# Patient Record
Sex: Male | Born: 1955
Health system: Southern US, Community
[De-identification: ages and names within clinical notes are randomized; demographics above are authoritative.]

## PROBLEM LIST (undated history)

## (undated) DIAGNOSIS — M75122 Complete rotator cuff tear or rupture of left shoulder, not specified as traumatic: Secondary | ICD-10-CM

## (undated) DIAGNOSIS — F419 Anxiety disorder, unspecified: Secondary | ICD-10-CM

## (undated) DIAGNOSIS — E7800 Pure hypercholesterolemia, unspecified: Secondary | ICD-10-CM

## (undated) DIAGNOSIS — E119 Type 2 diabetes mellitus without complications: Secondary | ICD-10-CM

## (undated) DIAGNOSIS — M1990 Unspecified osteoarthritis, unspecified site: Secondary | ICD-10-CM

## (undated) DIAGNOSIS — N200 Calculus of kidney: Secondary | ICD-10-CM

## (undated) HISTORY — PX: CHOLECYSTECTOMY: SHX55

## (undated) HISTORY — DX: Calculus of kidney: N20.0

## (undated) HISTORY — DX: Type 2 diabetes mellitus without complications: E11.9

## (undated) HISTORY — DX: Unspecified osteoarthritis, unspecified site: M19.90

## (undated) HISTORY — DX: Anxiety disorder, unspecified: F41.9

## (undated) HISTORY — DX: Pure hypercholesterolemia, unspecified: E78.00

## (undated) HISTORY — PX: LYMPH NODE BIOPSY: SHX201

---

## 2012-02-07 MED ORDER — MOMETASONE FUROATE 50 MCG/ACT NA SUSP
50 MCG/ACT | Freq: Every day | NASAL | Status: DC
Start: 2012-02-07 — End: 2012-04-16

## 2012-02-07 MED ORDER — NEXIUM 40 MG PO CPDR
40 MG | ORAL_CAPSULE | Freq: Every evening | ORAL | Status: DC
Start: 2012-02-07 — End: 2014-10-21

## 2012-02-07 NOTE — Patient Instructions (Signed)
Doctor recommends starting an over the counter allergy pill daily. Allegra is a good one.    You have been given a prescription for nexium & nasonex that has been electronically sent to your pharmacy for you to pick up. Please take as directed.    You were given samples of nasonex in the office today. Use 2 sprays in each nostril once a day. Use the left hand to spray the right nostril & the right hand to spray the left nostril. Sniff very gently.    Try propping the head of your bed up at night.    Prior to the nasal scope being used in the office today, your nose and throat were sprayed with a numbing agent.  Please wait at least 10-15 minutes after the procedure to eat or drink anything.  After this time has passed, you may then resume normal eating and drinking.      .        Gastroesophageal Reflux Disease (GERD): After Your Visit  Your Care Instructions  Gastroesophageal reflux disease (GERD) is the backward flow of stomach acid into the esophagus. The esophagus is the tube that leads from your throat to your stomach. A one-way valve prevents the stomach acid from moving up into this tube. When you have GERD, this valve does not close tightly enough.  If you have mild GERD symptoms including heartburn, you may be able to control the problem with antacids or over-the-counter medicine. Changing your diet, losing weight, and making other lifestyle changes can also help reduce symptoms.  Follow-up care is a key part of your treatment and safety. Be sure to make and go to all appointments, and call your doctor if you are having problems. It???s also a good idea to know your test results and keep a list of the medicines you take.  How can you care for yourself at home?  ?? Take your medicines exactly as prescribed. Call your doctor if you think you are having a problem with your medicine.  ?? Your doctor may recommend over-the-counter medicine. For mild or occasional indigestion, antacids, such as Tums, Gaviscon,  Mylanta, or Maalox, may help. Your doctor also may recommend over-the-counter acid reducers, such as Pepcid AC, Tagamet HB, Zantac 75, or Prilosec. Read and follow all instructions on the label. If you use these medicines often, talk with your doctor.  ?? Change your eating habits.  ?? It???s best to eat several small meals instead of two or three large meals.  ?? After you eat, wait 2 to 3 hours before you lie down.  ?? Chocolate, mint, and alcohol can make GERD worse.  ?? Spicy foods, foods that have a lot of acid (like tomatoes and oranges), and coffee can make GERD symptoms worse in some people. If your symptoms are worse after you eat a certain food, you may want to stop eating that food to see if your symptoms get better.  ?? Do not smoke or chew tobacco. Smoking can make GERD worse. If you need help quitting, talk to your doctor about stop-smoking programs and medicines. These can increase your chances of quitting for good.  ?? If you have GERD symptoms at night, raise the head of your bed 6 to 8 inches by putting the frame on blocks or placing a foam wedge under the head of your mattress. (Adding extra pillows does not work.)  ?? Do not wear tight clothing around your middle.  ?? Lose weight if you need to.  Losing just 5 to 10 pounds can help.  When should you call for help?  Call your doctor now or seek immediate medical care if:  ?? You have new or different belly pain.  ?? Your stools are black and tarlike or have streaks of blood.  Watch closely for changes in your health, and be sure to contact your doctor if:  ?? Your symptoms have not improved after 2 days.  ?? Food seems to catch in your throat or chest.    Where can you learn more?    Go to [email or web address] and sign in to your MyChart account.    Enter [phone] in the Search Health Information box to learn more about ???Gastroesophageal Reflux Disease (GERD): After Your Visit.???    If you do not have an account, please click on the ???Sign Up Now???  link.    ?? 2006-2012 [employer]. Care instructions adapted under license by [facility]. This care instruction is for use with your licensed healthcare professional. If you have questions about a medical condition or this instruction, always ask your healthcare professional. [employer] disclaims any warranty or liability for your use of this information.  Content Version: 9.4.94723; Last Revised: December 03, 2009

## 2012-02-09 NOTE — Progress Notes (Signed)
Subjective:      Patient ID: Fred Garcia is a 56 y.o. male.    HPI Comments: Pt seen and examined with a 4 month hx of increasing nasal congestion post nasal drip and globus. Pt has faial pressure daily. No treatment have made improvements up to this point. Pt denies any recent fever or chills, no vision changes. Pt state pressure is centered around his eyes and frontal region. Pt denies any difficulty swallowing, no SOB no dysphagia. Pt does state he has occasional reflux, no cough.     Other  Pertinent negatives include no coughing, fatigue, fever, neck pain, numbness or sore throat.       Review of Systems   Constitutional: Negative for fever, activity change, fatigue and unexpected weight change.   HENT: Negative for hearing loss, sore throat, rhinorrhea, trouble swallowing, neck pain, neck stiffness, voice change and sinus pressure.    Eyes: Negative.    Respiratory: Negative for apnea, cough and stridor.    Cardiovascular: Negative.    Allergic/Immunologic: Negative.    Neurological: Negative for dizziness, tremors, seizures, syncope, facial asymmetry and numbness.   Hematological: Negative.        Objective:   Physical Exam   Constitutional: He appears well-developed and well-nourished.   HENT:   Head: Normocephalic and atraumatic.   Right Ear: Hearing, tympanic membrane, external ear and ear canal normal.   Left Ear: Hearing, tympanic membrane, external ear and ear canal normal.   Nose: Mucosal edema and rhinorrhea present. No nasal deformity or septal deviation. Right sinus exhibits maxillary sinus tenderness. Left sinus exhibits maxillary sinus tenderness.   Mouth/Throat: Mucous membranes are normal. Normal dentition. No uvula swelling. No oropharyngeal exudate, posterior oropharyngeal edema or tonsillar abscesses.   Eyes: EOM are normal. Pupils are equal, round, and reactive to light.   Neck: Neck supple. No tracheal deviation present. No thyromegaly present.   Cardiovascular: Normal rate.     Pulmonary/Chest: Effort normal. No respiratory distress.   Lymphadenopathy:     He has no cervical adenopathy.   Neurological: He is alert. No cranial nerve deficit.   Skin: Skin is warm. No erythema.   Procedure Note    Pre-operative Diagnosis: Globus    Post-operative Diagnosis: same    Anesthesia: Lidocaine 2% and Neo-Synephrine 1/2%    Endoscopy Type:  Flexible Laryngoscopy    Procedure Details:    The patient was placed in the sitting position.  After topical anesthesia and decongestion, the 4 mm laryngoscope was passed.  The nasal cavities, nasopharynx, oropharynx, hypopharynx, and larynx were all examined.  Vocal cords were examined during respiration and phonation.  The following findings were noted:    Findings: Normal nasopharynx, normal epiglottis, normal tongue base, normal pyriform, normal TVC motion and mucosa, no subglottic masses or lesions post cricoid edema- moderate       Condition:  Stable.  Patient tolerated procedure well.    Complications:  None    Assessment:      LPR  Chronic Rhinitis   ? Chronic Sinusitis        Plan:      Pt seen and examined with a long standing history of nasal congestion, facial fullness and throat clearing. Today on exam pt has evidence of both LPR and chronic rhinitis . Pt will be maximized from a medical standpoint with both PPI therapy and nasal steroid. Pt will follow up in 6 weeks, if no better we will consider a Ct scan of the scan and  adding allergy regimen.

## 2012-03-27 MED ORDER — SUMATRIPTAN SUCCINATE 50 MG PO TABS
50 MG | ORAL_TABLET | Freq: Once | ORAL | Status: DC | PRN
Start: 2012-03-27 — End: 2014-09-08

## 2012-03-29 NOTE — Progress Notes (Signed)
Subjective:      Patient ID: Fred Garcia is a 56 y.o. male.    HPI Comments: Pt seen with Hx of LPR. Pt is overall improved with PPi therapy. Main complaint today and facial pressure centered over his frontal region as well as persistent and chronic headaches. No focal nasal drainage, no epistaxis no recent head trauma.     Other  Associated symptoms include congestion. Pertinent negatives include no coughing, diaphoresis, fever, neck pain or numbness.       Review of Systems   Constitutional: Negative for fever, diaphoresis, activity change, appetite change and unexpected weight change.   HENT: Positive for congestion. Negative for hearing loss, ear pain, nosebleeds, facial swelling, rhinorrhea, sneezing, drooling, mouth sores, trouble swallowing, neck pain, neck stiffness, dental problem, voice change, postnasal drip, sinus pressure and ear discharge.    Eyes: Negative for photophobia, pain, discharge, redness and visual disturbance.   Respiratory: Negative for apnea, cough, chest tightness, wheezing and stridor.    Cardiovascular: Negative.    Allergic/Immunologic: Negative.    Neurological: Negative for dizziness, seizures, facial asymmetry and numbness.   Hematological: Negative for adenopathy. Does not bruise/bleed easily.       Objective:   Physical Exam   Constitutional: He appears well-developed and well-nourished.   HENT:   Head: Normocephalic and atraumatic.   Right Ear: External ear normal.   Left Ear: External ear normal.   Nose: Nose normal.   Mouth/Throat: Oropharynx is clear and moist. No oropharyngeal exudate.   Eyes: EOM are normal. Pupils are equal, round, and reactive to light.   Neck: Normal range of motion. No tracheal deviation present. No thyromegaly present.   Cardiovascular: Normal rate.    Pulmonary/Chest: Effort normal. No respiratory distress.   Lymphadenopathy:     He has no cervical adenopathy.   Neurological: He is alert. No cranial nerve deficit.   Skin: Skin is warm. No erythema.        Assessment:      LPR- improved  Facial Pressure  ? Chronic Sinusitis vs. Migraine Variant        Plan:      Pt seen, LPR improved and will maintain current regimen for 6 weeks then consider PPi wean. Pt will also have Ct scan of sinuses to determine extent of sinus disease and any contributing factors to chronic HA and facial pain.

## 2012-04-16 MED ORDER — MOMETASONE FUROATE 50 MCG/ACT NA SUSP
50 MCG/ACT | Freq: Every day | NASAL | Status: DC
Start: 2012-04-16 — End: 2012-04-16

## 2012-04-16 MED ORDER — MOMETASONE FUROATE 50 MCG/ACT NA SUSP
50 MCG/ACT | Freq: Every day | NASAL | Status: DC
Start: 2012-04-16 — End: 2014-10-21

## 2012-04-20 NOTE — Progress Notes (Signed)
Subjective:      Patient ID: Fred Garcia is a 56 y.o. male.    HPI Comments: Pt is here for follow up Ct scan and progress with nasal steroids. Pt states he is improved from a nasal congestion standpoint but still has facial pain and pressure. No epistaxis, HA is relived by sleep but he often wakes up with them as we.. No relief for HA.  Ct scan reviewed.       Review of Systems   Constitutional: Negative for chills, activity change, appetite change and fatigue.   HENT: Negative for hearing loss, nosebleeds, congestion, facial swelling, drooling, mouth sores, postnasal drip, sinus pressure and tinnitus.    Eyes: Negative for photophobia, pain, discharge and itching.   Respiratory: Negative for cough, choking and chest tightness.    Cardiovascular: Negative.    Allergic/Immunologic: Negative for environmental allergies and food allergies.   Neurological: Positive for headaches. Negative for dizziness, tremors, seizures, speech difficulty, light-headedness and numbness.   Hematological: Negative.        Objective:   Physical Exam   Constitutional: He appears well-developed and well-nourished.   HENT:   Head: Normocephalic and atraumatic.   Right Ear: External ear normal.   Left Ear: External ear normal.   Nose: Nose normal.   Mouth/Throat: No oropharyngeal exudate.   Eyes: EOM are normal. Pupils are equal, round, and reactive to light.   Neck: Neck supple. No tracheal deviation present. No thyromegaly present.   Cardiovascular: Normal rate.    Pulmonary/Chest: No respiratory distress.   Lymphadenopathy:     He has no cervical adenopathy.   Neurological: He is alert.       Assessment:      Suspect Migraine Vs. Chronic HA  Mild sinus disease       Plan:      Pt is to continue his current regimen for nasal control. I do not think his HA is sinus related and I will refer him to Neurology. Pt will follow up in 3 months.

## 2012-04-24 NOTE — Telephone Encounter (Signed)
Spoke with patient appointment time for dr Noel Journey.

## 2013-06-28 ENCOUNTER — Inpatient Hospital Stay: Admit: 2013-06-28 | Discharge: 2013-06-28 | Discharge status: Against Medical Advice | Attending: Emergency Medicine

## 2013-06-28 MED ORDER — AMOXICILLIN-POT CLAVULANATE 500-125 MG PO TABS
500-125 MG | ORAL_TABLET | Freq: Two times a day (BID) | ORAL | Status: AC
Start: 2013-06-28 — End: 2013-07-08

## 2013-06-28 MED ORDER — PSEUDOEPH-BROMPHEN-DM 30-2-10 MG/5ML PO SYRP
2-30-10 MG/5ML | Freq: Four times a day (QID) | ORAL | Status: DC | PRN
Start: 2013-06-28 — End: 2014-09-08

## 2013-06-28 MED ORDER — PREDNISONE 10 MG PO TABS
10 MG | ORAL_TABLET | Freq: Every day | ORAL | Status: AC
Start: 2013-06-28 — End: 2013-07-08

## 2013-06-28 MED ADMIN — sodium chloride nebulizer 0.9 % solution 3 mL: RESPIRATORY_TRACT | @ 17:00:00 | NDC 00487930133

## 2013-06-28 MED ADMIN — levalbuterol (XOPENEX) nebulizer solution 1.25 mg: RESPIRATORY_TRACT | @ 17:00:00 | NDC 63402051500

## 2013-06-28 MED ADMIN — methylPREDNISolone sodium (SOLU-MEDROL) injection 125 mg: INTRAMUSCULAR | @ 17:00:00 | NDC 00009004725

## 2013-06-28 MED FILL — SODIUM CHLORIDE 0.9 % IN NEBU: 0.9 % | RESPIRATORY_TRACT | Qty: 3

## 2013-06-28 MED FILL — METHYLPREDNISOLONE SODIUM SUCC 125 MG IJ SOLR: 125 MG | INTRAMUSCULAR | Qty: 125

## 2013-06-28 MED FILL — LEVALBUTEROL HCL 1.25 MG/0.5ML IN NEBU: 1.25 MG/0.5ML | RESPIRATORY_TRACT | Qty: 1

## 2013-06-28 NOTE — ED Provider Notes (Signed)
Patient is a 57 y.o. male presenting with URI. The history is provided by the patient.   URI  Presenting symptoms: congestion, cough, facial pain, fatigue and rhinorrhea    Presenting symptoms: no ear pain, no fever and no sore throat    Severity:  Moderate  Onset quality:  Gradual  Duration:  2 weeks  Progression:  Worsening  Chronicity:  New  Associated symptoms: headaches and wheezing    Associated symptoms: no arthralgias        Review of Systems   Constitutional: Positive for fatigue. Negative for fever and chills.   HENT: Positive for congestion and rhinorrhea. Negative for ear pain, sore throat and sinus pressure.    Eyes: Negative for pain, discharge and redness.   Respiratory: Positive for cough and wheezing. Negative for shortness of breath.    Cardiovascular: Negative for chest pain.   Gastrointestinal: Negative for nausea, vomiting, abdominal pain and diarrhea.   Genitourinary: Negative for dysuria and frequency.   Musculoskeletal: Negative for back pain and arthralgias.   Skin: Negative for rash and wound.   Neurological: Positive for headaches. Negative for weakness.   Hematological: Negative for adenopathy.   All other systems reviewed and are negative.        Physical Exam   Constitutional: He is oriented to person, place, and time. He appears well-developed and well-nourished.   HENT:   Head: Normocephalic and atraumatic. No trismus in the jaw.   Right Ear: Hearing, tympanic membrane and external ear normal.   Left Ear: Hearing, tympanic membrane and external ear normal.   Nose: Mucosal edema present. Right sinus exhibits no maxillary sinus tenderness and no frontal sinus tenderness. Left sinus exhibits no maxillary sinus tenderness and no frontal sinus tenderness.   Mouth/Throat: Uvula is midline, oropharynx is clear and moist and mucous membranes are normal. No edematous.   Eyes: Conjunctivae, EOM and lids are normal. Pupils are equal, round, and reactive to light.   Neck: Normal range of  motion. Neck supple.   Cardiovascular: Normal rate, regular rhythm and normal heart sounds.    No murmur heard.  Pulmonary/Chest: Effort normal. He has rhonchi.   Abdominal: Soft. Bowel sounds are normal. There is no tenderness. There is no rigidity, no rebound, no guarding and no CVA tenderness.   Musculoskeletal: He exhibits no edema.   Neurological: He is alert and oriented to person, place, and time. He has normal strength. No cranial nerve deficit or sensory deficit. Coordination and gait normal. GCS eye subscore is 4. GCS verbal subscore is 5. GCS motor subscore is 6.   Skin: Skin is warm and dry. No abrasion and no rash noted.   Nursing note and vitals reviewed.      Procedures    MDM    --------------------------------------------- PAST HISTORY ---------------------------------------------  Past Medical History:  has a past medical history of Type II or unspecified type diabetes mellitus without mention of complication, not stated as uncontrolled; Sinus complaint; and GERD (gastroesophageal reflux disease).    Past Surgical History:  has past surgical history that includes Cholecystectomy.    Social History:  reports that he quit smoking about 6 years ago. His smoking use included Cigarettes, Pipe, and Cigars. He smoked 0.00 packs per day for 40 years. He has never used smokeless tobacco. He reports that  drinks alcohol. He reports that he does not use illicit drugs.    Family History: family history is not on file.     The patient???s home medications  have been reviewed.    Allergies: Review of patient's allergies indicates no known allergies.    -------------------------------------------------- RESULTS -------------------------------------------------  No results found for this visit on 06/28/13.       ------------------------- NURSING NOTES AND VITALS REVIEWED ---------------------------   The nursing notes within the ED encounter and vital signs as below have been reviewed.   BP 128/74   Pulse 74    Temp(Src) 97.9 ??F (36.6 ??C) (Oral)   Resp 16   Wt 230 lb (104.327 kg)   BMI 33 kg/m2   SpO2 98%  Oxygen Saturation Interpretation: Normal      ------------------------------------------ PROGRESS NOTES ------------------------------------------   I have spoken with the patient and discussed today???s results, in addition to providing specific details for the plan of care and counseling regarding the diagnosis and prognosis.  Their questions are answered at this time and they are agreeable with the plan.      --------------------------------- ADDITIONAL PROVIDER NOTES ---------------------------------     Inj Solumedrol 125 mg IM given along with Xopenex 1.25 mg via nebulizer.     This patient is stable for discharge.  I have shared the specific conditions for return, as well as the importance of follow-up.          Delma Officer, MD  06/28/13 [phone]

## 2013-06-28 NOTE — Discharge Instructions (Signed)
Bronchitis: After Your Visit  Your Care Instructions     Bronchitis is inflammation of the bronchial tubes, which carry air to the lungs. The tubes swell and produce mucus, or phlegm. The mucus and inflamed bronchial tubes make you cough. You may have trouble breathing.  Most cases of bronchitis are caused by viruses like those that cause colds. Antibiotics usually do not help and they may be harmful.  Bronchitis usually develops rapidly and lasts about 2 to 3 weeks in otherwise healthy people.  Follow-up care is a key part of your treatment and safety. Be sure to make and go to all appointments, and call your doctor if you are having problems. It's also a good idea to know your test results and keep a list of the medicines you take.  How can you care for yourself at home?   Take all medicines exactly as prescribed. Call your doctor if you think you are having a problem with your medicine.   Get some extra rest.   Take an over-the-counter pain medicine, such as acetaminophen (Tylenol), ibuprofen (Advil, Motrin), or naproxen (Aleve) to reduce fever and relieve body aches. Read and follow all instructions on the label.   Do not take two or more pain medicines at the same time unless the doctor told you to. Many pain medicines have acetaminophen, which is Tylenol. Too much acetaminophen (Tylenol) can be harmful.   Take an over-the-counter cough medicine that contains dextromethorphan to help quiet a dry, hacking cough so that you can sleep. Avoid cough medicines that have more than one active ingredient. Read and follow all instructions on the label.   Breathe moist air from a humidifier, hot shower, or sink filled with hot water. The heat and moisture will thin mucus so you can cough it out.   Do not smoke. Smoking can make bronchitis worse. If you need help quitting, talk to your doctor about stop-smoking programs and medicines. These can increase your chances of quitting for good.  When should you call for  help?  Call 911 anytime you think you may need emergency care. For example, call if:   You have severe trouble breathing.  Call your doctor now or seek immediate medical care if:   You have new or worse trouble breathing.   You cough up dark brown or bloody mucus (sputum).   You have a new or higher fever.   You have a new rash.  Watch closely for changes in your health, and be sure to contact your doctor if:   You cough more deeply or more often, especially if you notice more mucus or a change in the color of your mucus.   You are not getting better as expected.   Where can you learn more?   Go to [email or web address] and sign in to your MyChart account. Enter H333 in the Search Health Information box to learn more about "Bronchitis: After Your Visit."    If you do not have an account, please click on the "Sign Up Now" link.      2006-2014 [employer]. Care instructions adapted under license by [facility]. This care instruction is for use with your licensed healthcare professional. If you have questions about a medical condition or this instruction, always ask your healthcare professional. [employer] disclaims any warranty or liability for your use of this information.  Content Version: 10.1.311062; Current as of: May 18, 2011

## 2014-01-27 MED ADMIN — triamcinolone acetonide (KENALOG-40) injection 40 mg: 40 mg | INTRA_ARTICULAR | @ 18:00:00 | NDC 00003029305

## 2014-01-27 NOTE — Progress Notes (Signed)
Chief Complaint   Patient presents with   ??? Shoulder Pain     left shoulder pain for several years with no specific HOI        Fred Garcia is a 58 y.o. year old Caucasian male who is seen today  for evaluation of left shoulder pain.  he reports the pain has been ongoing for the past 2 years.  he does not recall a specific injury which started the pain.  he reports the pain is worse with use and overheadactivities, better with rest.  The patient does not have mechanical symptoms.   he does have night pain.  he denies a feeling of instability.  The prior treatments have been Injection  with no improvement  Therapy  completed, with no improvement.  The patient   has not responded to the treatment. The patient is right hand dominant. The patient is not working. The patients occupation is retirted.       Chief Complaint   Patient presents with   ??? Shoulder Pain     left shoulder pain for several years with no specific HOI     Past Medical History   Diagnosis Date   ??? Type II or unspecified type diabetes mellitus without mention of complication, not stated as uncontrolled (HCC)    ??? Sinus complaint    ??? GERD (gastroesophageal reflux disease)      Past Surgical History   Procedure Laterality Date   ??? Cholecystectomy       Current outpatient prescriptions:Atorvastatin Calcium (LIPITOR PO), Take by mouth, Disp: , Rfl: , ;  brompheniramine-pseudoephedrine-DM 30-2-10 MG/5ML syrup, Take 5 mLs by mouth 4 times daily as needed., Disp: 120 mL, Rfl: 0, ;  mometasone (NASONEX) 50 MCG/ACT nasal spray, 2 sprays by Nasal route daily. 2 sprays each nostril once a day, Disp: 3 Inhaler, Rfl: 3, ;  Testosterone 25 MG/2.5GM GEL, Place  onto the skin., Disp: , Rfl: ,   SUMAtriptan (IMITREX) 50 MG tablet, Take 1 tablet by mouth once as needed for Migraine for 1 dose., Disp: 5 tablet, Rfl: 0, ;  metformin (GLUCOPHAGE) 850 MG tablet, Take 850 mg by mouth 2 times daily (with meals)., Disp: , Rfl: , ;  glimepiride (AMARYL) 2 MG tablet, Take 2 mg  by mouth 2 times daily., Disp: , Rfl: , ;  NEXIUM 40 MG capsule, Take 1 capsule by mouth nightly., Disp: 30 capsule, Rfl: 3,   No Known Allergies  History     Social History   ??? Marital Status: Divorced     Spouse Name: N/A     Number of Children: N/A   ??? Years of Education: N/A     Occupational History   ??? Not on file.     Social History Main Topics   ??? Smoking status: Former Smoker -- 40 years     Types: Cigarettes, Pipe, Cigars     Quit date: 02/07/2007   ??? Smokeless tobacco: Never Used   ??? Alcohol Use: Yes   ??? Drug Use: No   ??? Sexual Activity: Not on file     Other Topics Concern   ??? Not on file     Social History Narrative     No family history on file.    REVIEW OF SYSTEMS:     General/Constitution:  (-)weight loss, (-)fever, (-)chills, (-)weakness.   Skin: (-) rash,(-) psoriasis,(-) eczema, (-)skin cancer.   Musculoskeletal: (-) fractures,  (-) dislocations,(-) collagen vascular disease, (-) fibromyalgia, (-) multiple  sclerosis, (-) muscular dystrophy, (-) RSD,(-) joint pain (-)swelling, (-) joint pain,swelling.  Neurologic: (-) epilepsy, (-)seizures,(-) brain tumor,(-) TIA, (-)stroke, (-)headaches, (-)Parkinson disease,(-) memory loss, (-) LOC.  Cardiovascular: (-) Chest pain, (-) swelling in legs/feet, (-) SOB, (-) cramping in legs/feet with walking.  Respiratory: (-) SOB, (-) Coughing, (-) night sweats.  GI: (-) nausea, (-) vomiting, (-) diarrhea, (-) blood in stool, (-) gastric ulcer.  Psychiatric: (-) Depression, (-) Anxiety, (-) bipolar disease, (-) Alzheimer's Disease  Allergic/Immunologic: (-) allergies latex, (-) allergies metal, (-) skin sensitivity.  Hematlogic: (-) anemia, (-) blood transfusion, (-) DVT/PE, (-) Clotting disorders      Subjective:    Constitution:    The patient is alert and oriented x 3, appears to be stated age and in no distress.  5 feet 10 inches, 230 pounds    Skin:    Upon inspection: the skin appears warm, dry and intact.  There is not a previous scar over the affected  area.There is not any cellulitis, lymphedema or cutaneous lesions noted in the lower extremities.   Upon palpation there is no induration noted.          Neurologic:    Gait: normal;  Motor exam of the upper extremities show: The reflexes in biceps/triceps/brachioradialis are equal and symmetric.  Sensory exam C5-T1 are normal bilaterally.          Cardiovascular:    The vascular exam is normal and is well perfused to distal extremities.There are 2+ radial pulses bilaterally, and motor and sensation is intact to median, ulnar, and radial, musclocutaneus, and axillary nerve distribution and grossly symmetric bilaterally.  There is cap refill noted less than two seconds in all digits. There is not edema of the bilateral upper extremities.  There is not varicosities noted in the distal extremities.      Lymph:    Upon palpation,  there is no lymphadenopathy noted in bilateral upper extremities.      Musculoskeletal:    Cervical Exam:    On physical exam, Fred CaperWilliam Garcia is well-developed, well-nourished, oriented to person, place and time.  his gait is normal.  On evaluation of his cervical spine, he has full range of motion of the cervical spine without pain.  There is no cervical tenderness to palpation.     Shoulder Exam:     On evaluation of his bilaterally upper extremities, his left shoulder has no deformity.  There is tenderness upon palpation of the anterior shoulder.  There is not evidence of scapular dyskinesis.  There is not muscle atrophy in shoulder girdle. The range of motion for the Right Shoulder is 160/45/T10 and for the Left shoulder is 140/40/L2.  Right shoulder Motor strength is 5/5 in the supraspinatus, 5/5 internal rotation and 5/5 in external rotation, and Left shoulder motor strength 5-/5 in supraspinatus, 5/5 in internal rotation, 5/5 in external rotation.        Right shoulder:  negative Impingement , negative Hawkins ,negative  Speeds,negative  Apprehension ,negative Obrien Load Shift, negative  Sperling manuver, negative Cross arm test.     Left shoulder:  positive Impingement , positive Hawkins ,positive  Speeds,negative  Apprehension ,negative Obrien Load Shift, negative Sperling manuver, negative Cross arm test.     XRAY:  None taken    MRI:  Disc from outside facilty reveals a partial RTC tear    Impression:   Encounter Diagnoses   Name Primary?   ??? Rotator cuff tear Yes   ??? Shoulder impingement    ???  Shoulder pain    ??? Biceps tendonitis        Plan: Natural history and expected course discussed. Questions answered.  Reduction in offending activity.   Consent was obtained by the patient.  Skin was prepped with alcohol and betadine.  The injection was given through the posterior aspect of Left shoulder into the subacromial space.  The patient tolerated the injection well.        I discussed the risks and benefits of the shoulder arthroscopy with the patient.  The risks include but are not limited to: infection, injuries to blood vessels and nerves, non relief of symptoms, intraoperative fracture, need for further operative intervention, blood loss, arthrofibrosis of shoulder, DVT/PE, MI and death.  The patient understands these risks and wishes to proceed with surgery. I will perform a Left shoulder arthroscopy, SAD and debridement in the fall 2015.

## 2014-04-21 NOTE — ED Notes (Signed)
Pt girlfriend states she heard "wooshing" noise from pt heart while they were walking. No abnormal sounds heard upon auscultation by this RN. Awaiting physician assessment.     Hilbert Bible, RN  04/21/14 2322

## 2014-04-21 NOTE — Discharge Instructions (Signed)
Palpitations: After Your Visit  Your Care Instructions     Heart palpitations are the uncomfortable sensation that your heart is beating fast or irregularly. You might feel pounding or fluttering in your chest. It might feel like your heart is skipping a beat.  Although palpitations may be caused by a heart problem, they also occur because of stress, fatigue, or use of alcohol, caffeine, or nicotine. Many medicines, including diet pills, antihistamines, decongestants, and some herbal products, can cause heart palpitations. Nearly everyone has palpitations from time to time.  Depending on your symptoms, your doctor may need to do more tests to try to find the cause of your palpitations.  Follow-up care is a key part of your treatment and safety. Be sure to make and go to all appointments, and call your doctor if you are having problems. It's also a good idea to know your test results and keep a list of the medicines you take.  How can you care for yourself at home?   Avoid caffeine, nicotine, and excess alcohol.   Do not take illegal drugs, such as methamphetamines and cocaine.   Do not take weight loss or diet medicines unless you talk with your doctor first.   Get plenty of sleep.   Do not overeat.   If you have palpitations again, take deep breaths and try to relax. This may slow a racing heart.   If you start to feel lightheaded, lie down to avoid injuries that might result if you pass out and fall down.   Keep a record of your palpitations and bring it to your next doctor's appointment. Write down:   The date and time.   Your pulse. (If your heart is beating fast, it may be hard to count your pulse.)   What you were doing when the palpitations started.   How long the palpitations lasted.   Any other symptoms.   If an activity causes palpitations, slow down or stop. Talk to your doctor before you do that activity again.   Take your medicines exactly as prescribed. Call your doctor if you think  you are having a problem with your medicine.  When should you call for help?  Call 911 anytime you think you may need emergency care. For example, call if:   You passed out (lost consciousness).   You have symptoms of a heart attack. These may include:   Chest pain or pressure, or a strange feeling in the chest.   Sweating.   Shortness of breath.   Pain, pressure, or a strange feeling in the back, neck, jaw, or upper belly or in one or both shoulders or arms.   Lightheadedness or sudden weakness.   A fast or irregular heartbeat.  After you call 911, the operator may tell you to chew 1 adult-strength or 2 to 4 low-dose aspirin. Wait for an ambulance. Do not try to drive yourself.   You have signs of a stroke. These may include:   Sudden numbness, paralysis, or weakness in your face, arm, or leg, especially on only one side of your body.   New problems with walking or balance.   Sudden vision changes.   Drooling or slurred speech.   New problems speaking or understanding simple statements, or feeling confused.   A sudden, severe headache that is different from past headaches.  Call your doctor now or seek immediate medical care if:   You have heart palpitations and:   Are dizzy or lightheaded, or   you feel like you may faint.   Have new or increased shortness of breath.  Watch closely for changes in your health, and be sure to contact your doctor if:   You continue to have heart palpitations.   Where can you learn more?   Go to [email or web address] and sign in to your MyChart account. Enter R508 in the Search Health Information box to learn more about "Palpitations: After Your Visit."    If you do not have an account, please click on the "Sign Up Now" link.      2006-2015 [employer]. Care instructions adapted under license by [facility]. This care instruction is for use with your licensed healthcare professional. If you have questions about a medical condition or  this instruction, always ask your healthcare professional. [employer], Inc[employer] disclaims any warranty or liability for your use of this information.  Content Version: 10.5.422740; Current as of: November 08, 2013

## 2014-04-22 ENCOUNTER — Inpatient Hospital Stay
Admit: 2014-04-22 | Discharge: 2014-04-22 | Disposition: A | Discharge status: Home / Self Care | Attending: Emergency Medicine

## 2014-04-22 LAB — CBC WITH AUTO DIFFERENTIAL
Basophils %: 1 % (ref 0–2)
Basophils Absolute: 0.04 E9/L (ref 0.00–0.20)
Eosinophils %: 6 % (ref 0–6)
Eosinophils Absolute: 0.52 E9/L — ABNORMAL HIGH (ref 0.05–0.50)
Hematocrit: 39 % (ref 37.0–54.0)
Hemoglobin: 13 g/dL (ref 12.5–16.5)
Lymphocytes %: 26 % (ref 20–42)
Lymphocytes Absolute: 2.07 E9/L (ref 1.50–4.00)
MCH: 30.6 pg (ref 26.0–35.0)
MCHC: 33.2 % (ref 32.0–34.5)
MCV: 92 fL (ref 80.0–99.9)
MPV: 10.3 fL (ref 7.0–12.0)
Monocytes %: 7 % (ref 2–12)
Monocytes Absolute: 0.59 E9/L (ref 0.10–0.95)
Neutrophils %: 60 % (ref 43–80)
Neutrophils Absolute: 4.88 E9/L (ref 1.80–7.30)
Platelets: 173 E9/L (ref 130–450)
RBC: 4.24 E12/L (ref 3.80–5.80)
RDW: 13.3 fL (ref 11.5–15.0)
WBC: 8.1 E9/L (ref 4.5–11.5)

## 2014-04-22 LAB — BASIC METABOLIC PANEL
Anion Gap: 15 mmol/L (ref 7–16)
BUN: 10 mg/dL (ref 6–20)
CO2: 23 mmol/L (ref 22–29)
Calcium: 9.1 mg/dL (ref 8.6–10.2)
Chloride: 103 mmol/L (ref 98–107)
Creatinine: 0.8 mg/dL (ref 0.7–1.2)
GFR African American: >60
GFR Non-African American: >60 mL/min/{1.73_m2} (ref >=60)
Glucose: 156 mg/dL — ABNORMAL HIGH (ref 74–109)
Potassium: 4.1 mmol/L (ref 3.5–5.0)
Sodium: 141 mmol/L (ref 132–146)

## 2014-04-22 LAB — BRAIN NATRIURETIC PEPTIDE: Pro-BNP: 29 pg/mL (ref 0–125)

## 2014-04-22 LAB — D-DIMER, QUANTITATIVE: D-Dimer, Quant: <200 ng/mL DDU

## 2014-04-22 LAB — TROPONIN: Troponin: <0.01 ng/mL (ref 0.00–0.03)

## 2014-04-22 NOTE — ED Provider Notes (Signed)
HPI:  04/22/2014,   Time: 12:12 AM         Katharina CaperWilliam Hakanson is a 58 y.o. male presenting to the ED for "wooshing in his chest when he walks", beginning several weeks ago.  The complaint has been intermittent, mild in severity, and worsened by strenuous exertion.  Patient presents with "whooshing sound in my chest when I walk". States his been going on for the last several weeks. He states he recently started exercising, when he strenuous and walking he feels a "[place]" in his chest, his girlfriend states tonight she hurt it when they were walking as well. He denies chest pain shortness of breath nausea vomiting swelling or recent weight gain. States  he had a stress test a few years ago and was told it was normal.    ROS:   Pertinent positives and negatives are stated within HPI, all other systems reviewed and are negative.  --------------------------------------------- PAST HISTORY ---------------------------------------------  Past Medical History:  has no past medical history on file.    Past Surgical History:  has no past surgical history on file.    Social History:  reports that he has never smoked. He does not have any smokeless tobacco history on file. He reports that he drinks alcohol.    Family History: family history is not on file.     The patient???s home medications have been reviewed.    Allergies: Review of patient's allergies indicates no known allergies.    -------------------------------------------------- RESULTS -------------------------------------------------  All laboratory and radiology results have been personally reviewed by myself   LABS:  Results for orders placed during the hospital encounter of 04/21/14   TROPONIN       Result Value Ref Range    Troponin <0.01  0.00 - 0.03 ng/mL   CBC WITH AUTO DIFFERENTIAL       Result Value Ref Range    WBC 8.1  4.5 - 11.5 E9/L    RBC 4.24  3.80 - 5.80 E12/L    Hemoglobin 13.0  12.5 - 16.5 g/dL    Hematocrit 76.139.0  60.737.0 - 54.0 %    MCV 92.0  80.0 - 99.9 fL     MCH 30.6  26.0 - 35.0 pg    MCHC 33.2  32.0 - 34.5 %    RDW 13.3  11.5 - 15.0 fL    Platelets 173  130 - 450 E9/L    MPV 10.3  7.0 - 12.0 fL    Neutrophils Relative 60  43 - 80 %    Lymphocytes Relative 26  20 - 42 %    Monocytes Relative 7  2 - 12 %    Eosinophils Relative Percent 6  0 - 6 %    Basophils Relative 1  0 - 2 %    Neutrophils Absolute 4.88  1.80 - 7.30 E9/L    Lymphocytes Absolute 2.07  1.50 - 4.00 E9/L    Monocytes Absolute 0.59  0.10 - 0.95 E9/L    Eosinophils Absolute 0.52 (*) 0.05 - 0.50 E9/L    Basophils Absolute 0.04  0.00 - 0.20 E9/L   BASIC METABOLIC PANEL       Result Value Ref Range    Sodium 141  132 - 146 mmol/L    Potassium 4.1  3.5 - 5.0 mmol/L    Chloride 103  98 - 107 mmol/L    CO2 23  22 - 29 mmol/L    Anion Gap 15  7 - 16 mmol/L  Glucose 156 (*) 74 - 109 mg/dL    BUN 10  6 - 20 mg/dL    CREATININE 0.8  0.7 - 1.2 mg/dL    GFR Non-African American >60  >=60 mL/min/1.73    GFR African American >60      Calcium 9.1  8.6 - 10.2 mg/dL   BRAIN NATRIURETIC PEPTIDE       Result Value Ref Range    Pro-BNP 29  0 - 125 pg/mL   D-DIMER, QUANTITATIVE       Result Value Ref Range    D-Dimer, Quant <200         RADIOLOGY:  Interpreted by Radiologist.  XR CHEST PORTABLE    Final Result: IMPRESSION:  Normal chest.           ------------------------- NURSING NOTES AND VITALS REVIEWED ---------------------------   The nursing notes within the ED encounter and vital signs as below have been reviewed.   BP 138/76    Pulse 79    Temp(Src) 98.3 ??F (36.8 ??C) (Temporal)    Resp 17    Ht 5\' 10"  (1.778 m)    Wt 230 lb (104.327 kg)    BMI 33.00 kg/m2      SpO2 97%   Oxygen Saturation Interpretation: Normal      ---------------------------------------------------PHYSICAL EXAM--------------------------------------       Constitutional/General: Alert and oriented x3, well appearing, non toxic in NAD  Head: NC/AT  Eyes: PERRL, EOMI  Mouth: Oropharynx clear, handling secretions, no trismus  Neck: Supple, full  ROM, no meningeal signs no JVD   Pulmonary: Lungs clear to auscultation bilaterally, no wheezes, rales, or rhonchi. Not in respiratory distress  Cardiovascular:  Regular rate and rhythm, no murmurs, gallops, or rubs. 2+ distal pulses. Remains unchanged under deep inspiration and Valsalva  Abdomen: Soft, non tender, non distended,   Extremities: Moves all extremities x 4. Warm and well perfused There is no pretibial edema nor calf tenderness bilaterally     Skin: warm and dry without rash  Neurologic: GCS 15,  Psych: Normal Affect    EKG:  This EKG is signed and interpreted by me.    Rate: 79  Rhythm: Sinus  Interpretation: Sinus rhythm, normal axis, no other acute findings  Comparison: no previous EKG available      ------------------------------ ED COURSE/MEDICAL DECISION MAKING----------------------  Medications - No data to display      Medical Decision Making:    Workup within normal limits, discussed the likely need for an outpatient echocardiogram. Have referred him to 2 local cardiologist near where he lives, follow-up with primary care and cardiology return for worsening signs or symptoms.,    Counseling:   The emergency provider has spoken with the patient and discussed today???s results, in addition to providing specific details for the plan of care and counseling regarding the diagnosis and prognosis.  Questions are answered at this time and they are agreeable with the plan.      --------------------------------- IMPRESSION AND DISPOSITION ---------------------------------    IMPRESSION  1. Palpitations        DISPOSITION  Disposition: Discharge to home  Patient condition is stable                  Raphael Gibney, DO  04/22/14 0021

## 2014-05-08 LAB — EKG 12-LEAD
ECG Heart Rate: 69 /min
ECG P Duration: 120 ms
ECG QRS Axis: -1 deg
ECG QRS Duration: 80 ms
ECG QT Dispersion: 20 ms
ECG QTC Interval: 422 ms
ECG RR Interval: 869 ms
P Axis: 32 deg
P-R Interval: 150 ms
Q-T Interval: 394 ms
T Axis: 23 deg

## 2014-06-30 ENCOUNTER — Ambulatory Visit
Admit: 2014-06-30 | Discharge: 2014-06-30 | Payer: PRIVATE HEALTH INSURANCE | Attending: Orthopaedic Surgery | Primary: Family Medicine

## 2014-06-30 DIAGNOSIS — M75122 Complete rotator cuff tear or rupture of left shoulder, not specified as traumatic: Secondary | ICD-10-CM

## 2014-06-30 MED ORDER — TRIAMCINOLONE ACETONIDE 40 MG/ML IJ SUSP
40 MG/ML | Freq: Once | INTRAMUSCULAR | Status: AC
Start: 2014-06-30 — End: 2014-06-30
  Administered 2014-06-30: 14:00:00 40 mg via INTRA_ARTICULAR

## 2014-06-30 NOTE — Progress Notes (Signed)
Chief Complaint   Patient presents with   ??? Shoulder Pain     wants to discuss left shoulder scope been hurting for years no history of injury  also wants cortisone injection        Fred Garcia is a 58 y.o. year old Caucasian male who is seen today for follow up of his left shoulder.  He did have relief with the previous cortisone injection.      Chief Complaint   Patient presents with   ??? Shoulder Pain     wants to discuss left shoulder scope been hurting for years no history of injury  also wants cortisone injection     Past Medical History   Diagnosis Date   ??? Type II or unspecified type diabetes mellitus without mention of complication, not stated as uncontrolled (HCC)    ??? Sinus complaint    ??? GERD (gastroesophageal reflux disease)      Past Surgical History   Procedure Laterality Date   ??? Cholecystectomy       Current outpatient prescriptions:Atorvastatin Calcium (LIPITOR PO), Take by mouth, Disp: , Rfl: ;  brompheniramine-pseudoephedrine-DM 30-2-10 MG/5ML syrup, Take 5 mLs by mouth 4 times daily as needed., Disp: 120 mL, Rfl: 0;  mometasone (NASONEX) 50 MCG/ACT nasal spray, 2 sprays by Nasal route daily. 2 sprays each nostril once a day, Disp: 3 Inhaler, Rfl: 3;  Testosterone 25 MG/2.5GM GEL, Place  onto the skin., Disp: , Rfl:   SUMAtriptan (IMITREX) 50 MG tablet, Take 1 tablet by mouth once as needed for Migraine for 1 dose., Disp: 5 tablet, Rfl: 0;  metformin (GLUCOPHAGE) 850 MG tablet, Take 850 mg by mouth 2 times daily (with meals)., Disp: , Rfl: ;  glimepiride (AMARYL) 2 MG tablet, Take 2 mg by mouth 2 times daily., Disp: , Rfl: ;  NEXIUM 40 MG capsule, Take 1 capsule by mouth nightly., Disp: 30 capsule, Rfl: 3  No Known Allergies  History     Social History   ??? Marital Status: Divorced     Spouse Name: N/A     Number of Children: N/A   ??? Years of Education: N/A     Occupational History   ??? Not on file.     Social History Main Topics   ??? Smoking status: Former Smoker -- 40 years     Types: Cigarettes,  Pipe, Cigars     Quit date: 02/07/2007   ??? Smokeless tobacco: Never Used   ??? Alcohol Use: Yes   ??? Drug Use: No   ??? Sexual Activity: Not on file     Other Topics Concern   ??? Not on file     Social History Narrative     History reviewed. No pertinent family history.    REVIEW OF SYSTEMS:     General/Constitution:  (-)weight loss, (-)fever, (-)chills, (-)weakness.   Skin: (-) rash,(-) psoriasis,(-) eczema, (-)skin cancer.   Musculoskeletal: (-) fractures,  (-) dislocations,(-) collagen vascular disease, (-) fibromyalgia, (-) multiple sclerosis, (-) muscular dystrophy, (-) RSD,(-) joint pain (-)swelling, (-) joint pain,swelling.  Neurologic: (-) epilepsy, (-)seizures,(-) brain tumor,(-) TIA, (-)stroke, (-)headaches, (-)Parkinson disease,(-) memory loss, (-) LOC.  Cardiovascular: (-) Chest pain, (-) swelling in legs/feet, (-) SOB, (-) cramping in legs/feet with walking.  Respiratory: (-) SOB, (-) Coughing, (-) night sweats.  GI: (-) nausea, (-) vomiting, (-) diarrhea, (-) blood in stool, (-) gastric ulcer.  Psychiatric: (-) Depression, (-) Anxiety, (-) bipolar disease, (-) Alzheimer's Disease  Allergic/Immunologic: (-) allergies latex, (-)  allergies metal, (-) skin sensitivity.  Hematlogic: (-) anemia, (-) blood transfusion, (-) DVT/PE, (-) Clotting disorders      Subjective:      Constitution:    The patient is alert and oriented x 3, appears to be stated age and in no distress.  5 feet 10 inches, 230 pounds    Skin:    Upon inspection: the skin appears warm, dry and intact.  There is not a previous scar over the affected area.There is not any cellulitis, lymphedema or cutaneous lesions noted in the lower extremities.   Upon palpation there is no induration noted.          Neurologic:    Gait: normal;  Motor exam of the upper extremities show: The reflexes in biceps/triceps/brachioradialis are equal and symmetric.  Sensory exam C5-T1 are normal bilaterally.          Cardiovascular:    The vascular exam is normal and is  well perfused to distal extremities.There are 2+ radial pulses bilaterally, and motor and sensation is intact to median, ulnar, and radial, musclocutaneus, and axillary nerve distribution and grossly symmetric bilaterally.  There is cap refill noted less than two seconds in all digits. There is not edema of the bilateral upper extremities.  There is not varicosities noted in the distal extremities.      Lymph:    Upon palpation,  there is no lymphadenopathy noted in bilateral upper extremities.      Musculoskeletal:    Cervical Exam:    On physical exam, Katharina CaperWilliam Longino is well-developed, well-nourished, oriented to person, place and time.  his gait is normal.  On evaluation of his cervical spine, he has full range of motion of the cervical spine without pain.  There is no cervical tenderness to palpation.     Shoulder Exam:     On evaluation of his bilaterally upper extremities, his left shoulder has no deformity.  There is tenderness upon palpation of the anterior shoulder.  There is not evidence of scapular dyskinesis.  There is not muscle atrophy in shoulder girdle. The range of motion for the Right Shoulder is 160/45/T10 and for the Left shoulder is 140/40/L2.  Right shoulder Motor strength is 5/5 in the supraspinatus, 5/5 internal rotation and 5/5 in external rotation, and Left shoulder motor strength 5-/5 in supraspinatus, 5/5 in internal rotation, 5/5 in external rotation.        Right shoulder:  negative Impingement , negative Hawkins ,negative  Speeds,negative  Apprehension ,negative Obrien Load Shift, negative Sperling manuver, negative Cross arm test.     Left shoulder:  positive Impingement , positive Hawkins ,positive  Speeds,negative  Apprehension ,negative Obrien Load Shift, negative Sperling manuver, negative Cross arm test.     XRAY:  None taken    MRI:  Disc from outside facilty reveals a partial RTC tear    Impression:   Encounter Diagnoses   Name Primary?   ??? Complete tear of left rotator cuff Yes    ??? Shoulder impingement, left    ??? Biceps tendonitis, left    ??? Left shoulder pain        Plan: Natural history and expected course discussed. Questions answered.  Reduction in offending activity.   I discussed the treatment options with the patient.  He would like to wait for surgery until January and I agree.  Consent was obtained by the patient.  Skin was prepped with alcohol and betadine.  The injection was given through the posterior aspect  of Left shoulder into the subacromial space.  The patient tolerated the injection well.    I will see him in December to update for surgery in January.

## 2014-08-22 ENCOUNTER — Ambulatory Visit: Admit: 2014-08-22 | Discharge: 2014-08-22 | Attending: Physical Medicine & Rehabilitation | Primary: Family Medicine

## 2014-08-22 DIAGNOSIS — Z0271 Encounter for disability determination: Secondary | ICD-10-CM

## 2014-08-22 NOTE — Patient Instructions (Signed)
We appreciate that you chose [facility] to provide your healthcare needs today.  We took great pleasure in caring for you.  You may receive a survey to help us learn how to make your next visit to a [facility] facility better than the last.   Your feedback is important to help us continually improve the service we can provide you. Please take the time to complete it thoughtfully if you receive one as we value the feedback in every survey.   In this survey we would appreciate that you answer "always" or "yes" for as many questions as possible (this is the answer we get credit for doing a good job).  If you feel there is a question you cannot answer "always" or "yes", please let us know before you leave today so we can remedy the situation right away. We look forward to continuing to provide you with excellent care. Considering the survey questions related to access to care, please keep in mind the urgency of your problem (i.e. was it an emergent or urgent visit or more routine)  and whether the urgency of that problem was handled appropriately by our office. We always do our best to prioritize the patients who need the care most urgently given our specialty is in short supply and there is a high demand for visits.  Thank you for your time and consideration.

## 2014-08-22 NOTE — Progress Notes (Signed)
08/22/2014    Fred Garcia was seen in my office today for a Disability Determination Examination.  The history was obtained from the claimant who was felt to be reliable.  The claimant was identified by photo identification.      I explained to the claimant that this examination was for evaluation purposes only and that no physician-patient relationship exists.  The claimant expressed understanding and agreement.  A release of information was signed and placed in the chart.  I advised the claimant not to cause bodily harm or significant pain with any of the requested activities    Fred Garcia is a right hand dominant man who reports to be disabled due to hypoglycemia episodes due to diabetes. He was diagnosed with diabetes 5 years ago.  He is non-insulin dependent. He has impaired vision.  He also has low back pain.  The onset of the disabling condition was with a gradual onset after no known injury in 2013. He also has a partial left rotator cuff tear and is planning for surgery. There was no shoulder injury.  The work up has included: Xray, MRI.  He thinks they showed degenerative changes   Symptoms have been getting worse since onset. Currently, the pain is located in the low back and does not radiate.     The pain is described as dull aching and rated as Pain Score:   4  The pain is intermittent and occurs daily.   The pain is worse with standing prolonged, lifting, bending and better with opioids.  Associated symptoms include stiffness.   Prior treatment: Effective medications have included Mobic.  Ineffective medications have included Tylenol.     Treatment YES/NO Effective/Ineffective   Therapy Yes No   Surgery No    Injection Yes Yes   Electric stimulation No    Massage No    Ultrasound No    Heat Yes No   Ice Yes No   Chiropractic  No    Formal pain treatment program No      History reviewed. No pertinent past medical history.    History reviewed. No pertinent past surgical history.    Social history:    History     Social History   ??? Marital Status: Single     Social History Main Topics   ??? Smoking status: Never Smoker    ??? Smokeless tobacco: Not on file   ??? Alcohol Use: Yes   ??? Drug Use: Not on file   ??? Sexual Activity: Not on file     The claimant has completed high school education.  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 The claimant does not require the use of an assistive device.  Fred Garcia is not limited in activities of daily living including self care tasks of feeding, grooming, dressing, bathing, cooking, cleaning and mobility tasks of getting out of bed, rising from chair, walking, balancing, going up and down steps.  Fred Garcia  feels his ADL's are limited due to pain but he can do them.  The claimant reports sitting tolerance of 30 minutes, standing tolerance of 30, and the ability to lift unknown pounds.   The claimant does  perform regular exercise- walking.  The claimant reports difficulty walking.     History reviewed. No pertinent family history.    No Known Allergies    Current Outpatient Prescriptions   Medication Sig Dispense  Refill   ??? atorvastatin (LIPITOR) 10 MG tablet Take 10 mg by mouth daily     ??? naphazoline (NAPHCON) 0.1 % ophthalmic solution Place 1 drop into both eyes daily     ??? prednisoLONE acetate (PRED FORTE) 1 % ophthalmic suspension Place 1 drop into both eyes daily     ??? glipiZIDE (GLUCOTROL) 10 MG tablet Take 10 mg by mouth 2 times daily (before meals)     ??? pioglitazone (ACTOS) 30 MG tablet Take 30 mg by mouth daily       No current facility-administered medications for this visit.       Review of Systems - For review of systems, positive symptoms are underlined and negative findings are not underlined.  General: chills, fatigue, fever, malaise, night sweats, weight gain,  weight loss. Psychological: anxiety, depression, suicidal ideation, sleep disturbances, behavioral disorder,  difficulty concentrating, disorientation, hallucinations, mood swings, obsessive thoughts, physical abuse,  sexual abuse. Ophthalmic: blurry vision, decreased vision, double vision, loss of vision, photophobia, use of corrective device. Ear Nose Throat: hearing loss, tinnitus, phonophobia, sensitivity to smells, vertigo, or vocal changes.Allergy/Immunology: seasonal allergies, watery eyes, itchy eyes, frequent infections.  Hematological and Lymphatic: bleeding problems, blood clots, bruising,  yellowing of the skin, swollen lymph nodes. Endocrine:  polydypsia, polyuria, temperature intolerance. Respiratory: cough, shortness of breath, wheezing. Cardiovascular: syncope, chest pain, dyspnea on exertion, edema, irregular heartbeat,  palpitations. Gastrointestinal: abdominal pain, constipation, diarrhea,  decreased appetite, heartburn, hematemesis, melena, nausea, vomiting, stool incontinence, abnormal swallowing.Genito-Urinary: dysuria, hematuria, incontinence, frequency, urgency. Musculoskeletal: joint pain, stiffness, swelling, muscle pain, muscle  tenderness. Neurological: confusion, memory loss, dizziness, gait disturbance, headaches, impaired coordination, decreased balance, numbness/tingling, seizures, speech problems, tremors,weakness.  Dermatological:  hair changes, nail changes, pruritus, rash.    PHYSICAL EXAMINATION: Blood pressure 129/85, pulse 65, height 5\' 10"  (1.778 m), weight 237 lb (107.502 kg). The claimant was cooperative with the examination. Please see the neuro-musculoskeletal data sheet for more details of the physical examination.  General: No apparent distress.  Appears of average intellect.  Behavior was appropriate. Able to relate.  Personal appearance was well groomed. Psychosocial: Mood and affect were appropriate.  HEENT: Snellen eye chart 20/200 left, 20/200 on right.  No palpable cervical lymph nodes. Anicteric.  No conjunctival injection.  Mucosa moist and pink. Cardiovascular: Regular  Rate and Rhythm.  No peripheral edema.  Peripheral pulses 2+ at dorsalis pedis and radial arteries. Pulmonary: Unlabored and Regular Abdomen: Soft, non-tender.  No abdominal bruit or pulsatile mass. Skin: Normal turgor without wound or rash. Musculoskeletal: Spurling negative bilaterally.  Straight leg raise negative bilaterally.  Lumbar AROM painful. Tender right lumbar paraspinals. + L Kennedy-Hawkins and Empty can.  Negative Speed. + L Scarf.   Otherwise, there was no crepitus, pain with ROM maneuvers, warmth, edema, effusion, erythema, tenderness to palpation, synovial thickening,  deformity including contracture, bony enlargement,varus/valgus deformity, ulnar deviation or joint instability or ligamentous laxity. Neurologic:  Awake, alert, and oriented to person place and time.  Speech is fluent.  Hearing is intact for conversation. Sensation was intact for temperature, light touch, vibration, proprioception.  Coordination was intact in the upper extremities for finger to nose and in the lower extremities for heel to shin.  Rapid alternating movements were intact in the upper and lower extremities.  Muscle tendon reflexes were 2+ and symmetric at the biceps, triceps, brachioradialis, patella and achilles.  Romberg was negative.  Heel and toe walking were intact. Gait was normal. There was not a visible limp. It is  safe for the claimant to walk without a hand-held assistive device. The claimant is able to walk both short and long distances on level and on uneven surfaces.  Functional status: The claimant was able to sit/stand/walk without an assistive device independently. Fred CaperWilliam Garcia  was able to dress independently and reach overhead. The claimant was able to write, shake hands and perform fine motor movements.     Impression:   This is a 58 y.o. year old claimant with   1. Diabetes Mellitus, non-insulin dependent  2. Spondylogenic low back pain  3. Left shoulder partial rotator cuff tear  4. Obesity    There  are significant medically determinable impairments. The claimant can hear, speak, remember, understand, concentrate, interact, and adapt without limitation. The claimaint can lift/carry up to 30 pounds at the waist level.  Fred CaperWilliam Garcia can handle objects without limitation. The claimant can sit for a maximum of 60 minute intervals for a maximum of 8 hours per day;  can stand for a maximum of 30 minute intervals for a total of 6 hours per day; can be in the upright position either standing or walking for a total of 6 hours a day. Fred CaperWilliam Garcia can walk independently community distances without assistive device.  The claimant is a falls risk and should avoid unprotected heights due to hypoglycemic episodes.Claimant should avoid repetitive bending, lifting and twisting.  The claimant should have the ability to sit or stand at will.      If awarded benefits the claimant would be able to manage them.     The claimant does not report psychiatric conditions as a cause of disability.  A psychiatric consultative examination is not recommended to further evaluate.     The above analysis is based upon the available information at the time of this examination including the history given by the claimant,  the medical records and tests reviewed and the physical findings. It is assumed that the material provided is correct. Any medical recommendations offered are provided as guidance and not as medical orders. I have not provided care for this claimaint.  I have seen the claimaint one time on 08/22/2014 , for the purpose of a disability determination.  The opinions expressed are based solely on the information provided to me and on the history and medical examination performed on 08/22/2014.      Respectfully submitted,       Rivka BarbaraStephanie Carsen Machi, D.O., P.T.  Board Certified Physical Medicine and Rehabilitation  Board Certified Electrodiagnostic Medicine

## 2014-09-08 ENCOUNTER — Ambulatory Visit
Admit: 2014-09-08 | Discharge: 2014-09-08 | Payer: PRIVATE HEALTH INSURANCE | Attending: Orthopaedic Surgery | Primary: Family Medicine

## 2014-09-08 DIAGNOSIS — M75122 Complete rotator cuff tear or rupture of left shoulder, not specified as traumatic: Secondary | ICD-10-CM

## 2014-09-08 NOTE — Progress Notes (Signed)
Chief Complaint   Patient presents with   ??? Shoulder Pain     update for left shoulder SX        Fred Garcia returns today for follow up of his left shoulder pain.  he reports that the pain in the shoulder is unchanged.  he has not been going to physical therapy.  he is also complaining of nothing.  The patient is right dominant.  The patient's pain level is a 0/10.   The patient did respond to treatment.    Past Medical History   Diagnosis Date   ??? Type II or unspecified type diabetes mellitus without mention of complication, not stated as uncontrolled (HCC)    ??? Sinus complaint    ??? GERD (gastroesophageal reflux disease)      Past Surgical History   Procedure Laterality Date   ??? Cholecystectomy       Current outpatient prescriptions:pioglitazone (ACTOS) 30 MG tablet, Take 30 mg by mouth daily, Disp: , Rfl: ;  Atorvastatin Calcium (LIPITOR PO), Take by mouth, Disp: , Rfl: ;  mometasone (NASONEX) 50 MCG/ACT nasal spray, 2 sprays by Nasal route daily. 2 sprays each nostril once a day, Disp: 3 Inhaler, Rfl: 3;  Testosterone 25 MG/2.5GM GEL, Place  onto the skin., Disp: , Rfl:   glimepiride (AMARYL) 2 MG tablet, Take 2 mg by mouth 2 times daily., Disp: , Rfl: ;  NEXIUM 40 MG capsule, Take 1 capsule by mouth nightly., Disp: 30 capsule, Rfl: 3  No Known Allergies  History     Social History   ??? Marital Status: Divorced     Spouse Name: N/A     Number of Children: N/A   ??? Years of Education: N/A     Occupational History   ??? Not on file.     Social History Main Topics   ??? Smoking status: Former Smoker -- 40 years     Types: Cigarettes, Pipe, Cigars     Quit date: 02/07/2007   ??? Smokeless tobacco: Never Used   ??? Alcohol Use: Yes   ??? Drug Use: No   ??? Sexual Activity: Not on file     Other Topics Concern   ??? Not on file     Social History Narrative     History reviewed. No pertinent family history.      General/Constitution:  (-)weight loss, (-)fever, (-)chills, (-)weakness.   Skin: (-) rash,(-) psoriasis,(-) eczema, (-)skin  cancer.   Musculoskeletal: (-) fractures,  (-) dislocations,(-) collagen vascular disease, (-) fibromyalgia, (-) multiple sclerosis, (-) muscular dystrophy, (-) RSD,(-) joint pain (-)swelling, (-) joint pain,swelling.  Neurologic: (-) epilepsy, (-)seizures,(-) brain tumor,(-) TIA, (-)stroke, (-)headaches, (-)Parkinson disease,(-) memory loss, (-) LOC.  Cardiovascular: (-) Chest pain, (-) swelling in legs/feet, (-) SOB, (-) cramping in legs/feet with walking.  Respiratory: (-) SOB, (-) Coughing, (-) night sweats.  GI: (-) nausea, (-) vomiting, (-) diarrhea, (-) blood in stool, (-) gastric ulcer.  Psychiatric: (-) Depression, (-) Anxiety, (-) bipolar disease, (-) Alzheimer's Disease  Allergic/Immunologic: (-) allergies latex, (-) allergies metal, (-) skin sensitivity.  Hematlogic: (-) anemia, (-) blood transfusion, (-) DVT/PE, (-) Clotting disorders    SUBJECTIVE:     Vital signs are stable.  In general, He is awake, alert and oriented X3, in no apparent distress.  Examination of His HENT reveals normocephalic, atraumatic.  PERRLA/EOMI sclerea are white.  Conjunctivae are clear.  TM's are intact.  Pharynx is pink and moist.  Uvula and tongue are midline.  Heart: Positive S1 and positive S2 with regular rate and rhythm.  Lungs: Clear to auscultation bilaterally without rales, rhonchi or wheezes.  Abdomen: soft, nontender.  Positive bowel sounds.  No organomegaly.  No guarding or rigidity.    Constitution:    The patient is alert and oriented x 3, appears to be stated age and in no distress.  Ht. 5 ft 10 in., Wt. 230 lbs.    Skin:    Upon inspection: the skin appears warm, dry and intact.  There is not a previous scar over the affected area.There is not any cellulitis, lymphedema or cutaneous lesions noted in the lower extremities.   Upon palpation there is no induration noted.          Neurologic:    Gait: normal;  Motor exam of the upper extremities show: The reflexes in biceps/triceps/brachioradialis are equal and  symmetric.  Sensory exam C5-T1 are normal bilaterally.          Cardiovascular:    The vascular exam is normal and is well perfused to distal extremities.There are 2+ radial pulses bilaterally, and motor and sensation is intact to median, ulnar, and radial, musclocutaneus, and axillary nerve distribution and grossly symmetric bilaterally.  There is cap refill noted less than two seconds in all digits. There is not edema of the bilateral upper extremities.  There is not varicosities noted in the distal extremities.      Lymph:    Upon palpation,  there is no lymphadenopathy noted in bilateral upper extremities.      Musculoskeletal:    Gait: antalgic; examination of the nails and digits reveal no cyanosis or clubbing.      Cervical Exam:    On physical exam, Fred Garcia is well-developed, well-nourished, oriented to person, place and time.  his gait is normal.  On evaluation of his cervical spine, he has full range of motion of the cervical spine without pain.  There is no cervical tenderness to palpation.     Shoulder Exam:     On evaluation of his bilaterally upper extremities, his left shoulder has no deformity.  There is tenderness upon palpation of the anterior and lateral shoulder.   There is not evidence of scapular dyskinesis.  There is not muscle atrophy in shoulder girdle. The range of motion for the Right Shoulder is 170/50/T6 and for the Left shoulder is 140/40/L2.Right shoulder Motor strength is 5/5 in the supraspinatus, 5/5 internal rotation and 5/5 in external rotation, and Left shoulder motor strength 5-/5 in supraspinatus, 5-/5 in internal rotation, 5-/5 in external rotation.        Right shoulder:  negative Impingement , negative Hawkins ,negative  Speeds,negative  Apprehension ,negative Obrien Load Shift, negative Sperling manuver, negative Cross arm test.     Left shoulder:  positive Impingement , positive Hawkins ,positive  Speeds,negative  Apprehension ,negative Obrien Load Shift, negative  Sperling manuver, negative Cross arm test.                 Impression:       Encounter Diagnoses   Name Primary?   ??? Complete tear of left rotator cuff Yes   ??? Shoulder impingement, left    ??? Biceps tendonitis, left    ??? Left shoulder pain          Plan:  Natural history and expected course discussed. Questions answered.  Patient will undergo Left shoulder arthroscopy with possible rotator cuff repair, debridement and subacromial decompression.  The risks and  benefits were reviewed with the patient such as: arthorfibrosis shoulder, DVT, infection,  injuries to blood vessels and nerves, non relief of symptoms, recurrent tear, continued pain, worsening of symptoms.

## 2014-10-21 NOTE — Patient Instructions (Addendum)
Patient instructed:  Photo Identification/Insurance cards: Yes  No make-up, nail polish, jewelry, valuables or contacts: Yes  No drugs/alcoholic beverages/tobacco x 24 hours pre-op.Yes  Appropriate clothing (according to surgery):Yes  NPO after midnight (unless otherwise instructed by Physician):yes  Antibacterial wash:Yes  Verbalizes understanding of surgical procedures & instructions:Yes  Patient will require any assistance with home care of discharge:No  Nursing Home Patient: No    Primary Care Physician: Dr.Naples    EBOLA VIRUS DISEASE SCREEN    Been in contact with individual know or suspected to have Ebola in the last 21 days?No  Resided in-or travel to-an area in the last 21 days where EVD transmission is active? No  Direct handling of bats or non-human primates from disease-endemic areas in the last 21 days?No  IF ANY OF THE ABOVE QUESTIONS ARE "YES",CONTIUE WITH NEXT SEVEN QUESTIONS  Elevated fever ?no  Sever headache?no  Muscle pain?no  Vomiting?no  Diarrhea?no  Abdominal pain?no  Unexplained hemorrhage/bleeding?no

## 2014-10-22 LAB — BASIC METABOLIC PANEL
Anion Gap: 9 mmol/L (ref 7–16)
BUN: 14 mg/dL (ref 6–20)
CO2: 31 mmol/L — ABNORMAL HIGH (ref 22–29)
Calcium: 10.1 mg/dL (ref 8.6–10.2)
Chloride: 100 mmol/L (ref 98–107)
Creatinine: 0.9 mg/dL (ref 0.7–1.2)
GFR African American: >60
GFR Non-African American: >60 mL/min/{1.73_m2} (ref >=60)
Glucose: 151 mg/dL — ABNORMAL HIGH (ref 74–109)
Potassium: 5.2 mmol/L — ABNORMAL HIGH (ref 3.5–5.0)
Sodium: 140 mmol/L (ref 132–146)

## 2014-10-23 NOTE — Anesthesia Pre-Procedure Evaluation (Addendum)
Department of Anesthesiology  Preprocedure Note       Name:  Fred CaperWilliam Garcia   Age:  59 y.o.  DOB:  1956/07/16                                          MRN:  [id number]         Date:  10/23/2014      Surgeon:Woods    Procedure:left shoulder scope    Medications prior to admission:   Prior to Admission medications    Medication Sig Start Date End Date Taking? Authorizing Provider   loratadine (CLARITIN) 10 MG tablet Take 10 mg by mouth daily    Historical Provider, MD   pioglitazone (ACTOS) 30 MG tablet Take 30 mg by mouth daily    Historical Provider, MD   atorvastatin (LIPITOR) 10 MG tablet Take 10 mg by mouth daily    Historical Provider, MD   glipiZIDE (GLUCOTROL) 10 MG tablet Take 10 mg by mouth 2 times daily (before meals)    Historical Provider, MD   pioglitazone (ACTOS) 30 MG tablet Take 30 mg by mouth daily    Historical Provider, MD   Atorvastatin Calcium (LIPITOR PO) Take by mouth    Historical Provider, MD       Current medications:    Current Outpatient Prescriptions   Medication Sig Dispense Refill   ??? loratadine (CLARITIN) 10 MG tablet Take 10 mg by mouth daily     ??? pioglitazone (ACTOS) 30 MG tablet Take 30 mg by mouth daily     ??? atorvastatin (LIPITOR) 10 MG tablet Take 10 mg by mouth daily     ??? glipiZIDE (GLUCOTROL) 10 MG tablet Take 10 mg by mouth 2 times daily (before meals)     ??? pioglitazone (ACTOS) 30 MG tablet Take 30 mg by mouth daily     ??? Atorvastatin Calcium (LIPITOR PO) Take by mouth       No current facility-administered medications for this encounter.       Allergies:  No Known Allergies    Problem List:    Patient Active Problem List   Diagnosis Code   ??? Disability examination Z02.71   ??? Rotator cuff tear M75.100   ??? Shoulder impingement M75.40   ??? Biceps tendonitis M75.20   ??? Shoulder pain M25.519       Past Medical History:        Diagnosis Date   ??? Type II or unspecified type diabetes mellitus without mention of complication, not stated as uncontrolled (HCC)    ??? Sinus complaint    ??? GERD  (gastroesophageal reflux disease)    ??? Hyperlipidemia        Past Surgical History:        Procedure Laterality Date   ??? Cholecystectomy     ??? Neck exploration         Social History:    History   Substance Use Topics   ??? Smoking status: Former Smoker -- 40 years     Types: Cigarettes, Pipe, Cigars     Quit date: 02/07/2007   ??? Smokeless tobacco: Never Used   ??? Alcohol Use: Yes      Comment: social  Counseling given: Not Answered      Vital Signs (Current): There were no vitals filed for this visit.                                           BP Readings from Last 3 Encounters:   08/22/14 129/85   04/21/14 138/76   06/28/13 128/74       CBC   Lab Results   Component Value Date    WBC 8.1 04/21/2014    RBC 4.24 04/21/2014    HGB 13.0 04/21/2014    HCT 39.0 04/21/2014    MCV 92.0 04/21/2014    RDW 13.3 04/21/2014    PLT 173 04/21/2014     CMP    Lab Results   Component Value Date    NA 140 10/22/2014    K 5.2 10/22/2014    CL 100 10/22/2014    CO2 31 10/22/2014    BUN 14 10/22/2014    CREATININE 0.9 10/22/2014    GFRAA >60 10/22/2014    LABGLOM >60 10/22/2014    GLUCOSE 151 10/22/2014    CALCIUM 10.1 10/22/2014     BMP    Lab Results   Component Value Date    NA 140 10/22/2014    K 5.2 10/22/2014    CL 100 10/22/2014    CO2 31 10/22/2014    BUN 14 10/22/2014    CREATININE 0.9 10/22/2014    CALCIUM 10.1 10/22/2014    GFRAA >60 10/22/2014    LABGLOM >60 10/22/2014    GLUCOSE 151 10/22/2014     POCGlucose  Recent Labs      10/22/14   0928   GLUCOSE  151*        Coags  No results found for: PROTIME, INR, APTT    HCG (If Applicable) No results found for: PREGTESTUR, PREGSERUM, HCG, HCGQUANT     ABGs   No results found for: PHART, PO2ART, PCO2ART, HCO3ART, BEART, O2SATART     Type & Screen (If Applicable)  No results found for: [facility]  Active Problem List with ICD10 Codes  Patient Active Problem List   Diagnosis Code   ??? Disability examination Z02.71   ??? Rotator cuff tear M75.100   ??? Shoulder  impingement M75.40   ??? Biceps tendonitis M75.20   ??? Shoulder pain M25.519       Luka Stohr A Sosaia Pittinger, MD  October 23, 2014  8:50 AM    NPO Status:  >8.H                                                                               BMI:   Wt Readings from Last 3 Encounters:   10/21/14 230 lb (104.327 kg)   09/08/14 230 lb (104.327 kg)   08/22/14 237 lb (107.502 kg)     There is no weight on file to calculate BMI.    Anesthesia Evaluation  Patient summary reviewed  Airway: Mallampati: III       Comment: Flat face   Dental:          Pulmonary:  breath sounds clear to auscultation      ROS comment: Ex smoker   Cardiovascular:  Exercise tolerance: good (>4 METS),         ECG reviewed  Rhythm: regular  Rate: normal  Echocardiogram reviewed         ROS comment: Dyslipidemia  EKG (09/24/14)=SR, normal EKG    ECHO=IMPRESSION-   1. Unremarkable study.    Ejection fraction-    SPECT gated images of the left ventricular myocardium were obtained for  purposes of left ventricular ejection fraction determination. Left  ventricular ejection fraction is calculated at 65%.     IMPRESSION-   1. LVEF 65%.        Transcriptionist- DH   Read ByFredric Dine M.D.  Released ByFredric Dine M.D.  Released Date Time- 07/09/09     Medically cleared        Neuro/Psych:   (+) neuromuscular disease:,    GI/Hepatic/Renal:   (+) GERD:,         Endo/Other:    (+) Type II DM, ,       Abdominal:                    Anesthesia Plan    ASA 2     general   (Medical clearance on chart  Glidescope available)  intravenous induction   Anesthetic plan and risks discussed with patient.    Plan discussed with CRNA.            Lenore Manner, MD   10/23/2014

## 2014-10-24 ENCOUNTER — Inpatient Hospital Stay
Admit: 2014-10-24 | Discharge: 2014-10-29 | Payer: PRIVATE HEALTH INSURANCE | Attending: Orthopaedic Surgery | Primary: Family Medicine

## 2014-10-24 DIAGNOSIS — M75122 Complete rotator cuff tear or rupture of left shoulder, not specified as traumatic: Secondary | ICD-10-CM

## 2014-10-24 LAB — POCT GLUCOSE: Glucose: 147 mg/dL

## 2014-10-24 MED ORDER — MEPERIDINE HCL 50 MG/ML IJ SOLN
50 MG/ML | Freq: Once | INTRAMUSCULAR | Status: AC | PRN
Start: 2014-10-24 — End: 2014-10-24
  Administered 2014-10-24: 18:00:00 50 mg via INTRAMUSCULAR

## 2014-10-24 MED ORDER — MEPERIDINE HCL 50 MG/ML IJ SOLN
50 MG/ML | INTRAMUSCULAR | Status: DC | PRN
Start: 2014-10-24 — End: 2014-10-25

## 2014-10-24 MED ORDER — FENTANYL CITRATE 0.05 MG/ML IJ SOLN
0.05 MG/ML | INTRAMUSCULAR | Status: DC | PRN
Start: 2014-10-24 — End: 2014-10-25

## 2014-10-24 MED ORDER — HYDRALAZINE HCL 20 MG/ML IJ SOLN
20 MG/ML | INTRAMUSCULAR | Status: DC | PRN
Start: 2014-10-24 — End: 2014-10-25

## 2014-10-24 MED ORDER — HYDROMORPHONE HCL 1 MG/ML IJ SOLN
1 MG/ML | INTRAMUSCULAR | Status: DC | PRN
Start: 2014-10-24 — End: 2014-10-25

## 2014-10-24 MED ORDER — MORPHINE SULFATE 2 MG/ML IJ SOLN
2 MG/ML | INTRAMUSCULAR | Status: DC | PRN
Start: 2014-10-24 — End: 2014-10-25

## 2014-10-24 MED ORDER — HYDROCODONE-ACETAMINOPHEN 5-325 MG PO TABS
5-325 MG | Freq: Once | ORAL | Status: AC | PRN
Start: 2014-10-24 — End: 2014-10-24

## 2014-10-24 MED ORDER — HYDROCODONE-ACETAMINOPHEN 5-325 MG PO TABS
5-325 MG | Freq: Once | ORAL | Status: AC | PRN
Start: 2014-10-24 — End: 2014-10-24
  Administered 2014-10-24: 17:00:00 2 via ORAL

## 2014-10-24 MED ORDER — LABETALOL HCL 5 MG/ML IV SOLN
5 MG/ML | INTRAVENOUS | Status: DC | PRN
Start: 2014-10-24 — End: 2014-10-25

## 2014-10-24 MED ORDER — PROMETHAZINE HCL 25 MG/ML IJ SOLN
25 MG/ML | INTRAMUSCULAR | Status: DC | PRN
Start: 2014-10-24 — End: 2014-10-25
  Administered 2014-10-24: 18:00:00 25 mg via INTRAMUSCULAR

## 2014-10-24 MED ORDER — DEXTROSE 5 % IV SOLN
5 % | Freq: Once | INTRAVENOUS | Status: AC
Start: 2014-10-24 — End: 2014-10-24
  Administered 2014-10-24: 15:00:00 2 g via INTRAVENOUS

## 2014-10-24 MED ORDER — PROMETHAZINE HCL 25 MG/ML IJ SOLN
25 MG/ML | Freq: Once | INTRAMUSCULAR | Status: AC | PRN
Start: 2014-10-24 — End: 2014-10-24

## 2014-10-24 MED ORDER — HYDROCODONE-ACETAMINOPHEN 7.5-325 MG PO TABS
ORAL_TABLET | Freq: Four times a day (QID) | ORAL | Status: AC | PRN
Start: 2014-10-24 — End: ?

## 2014-10-24 MED ORDER — DIPHENHYDRAMINE HCL 50 MG/ML IJ SOLN
50 MG/ML | Freq: Once | INTRAMUSCULAR | Status: AC | PRN
Start: 2014-10-24 — End: 2014-10-24

## 2014-10-24 MED ORDER — SODIUM CHLORIDE 0.9 % IV SOLN
0.9 % | INTRAVENOUS | Status: DC
Start: 2014-10-24 — End: 2014-10-25

## 2014-10-24 MED ORDER — LACTATED RINGERS IV SOLN
INTRAVENOUS | Status: DC
Start: 2014-10-24 — End: 2014-10-25
  Administered 2014-10-24: 14:00:00 via INTRAVENOUS

## 2014-10-24 MED FILL — DIPHENHYDRAMINE HCL 50 MG/ML IJ SOLN: 50 MG/ML | INTRAMUSCULAR | Qty: 0.25

## 2014-10-24 MED FILL — HYDRALAZINE HCL 20 MG/ML IJ SOLN: 20 MG/ML | INTRAMUSCULAR | Qty: 0.25

## 2014-10-24 MED FILL — PROMETHAZINE HCL 25 MG/ML IJ SOLN: 25 MG/ML | INTRAMUSCULAR | Qty: 1

## 2014-10-24 MED FILL — DEMEROL 50 MG/ML IJ SOLN: 50 MG/ML | INTRAMUSCULAR | Qty: 1

## 2014-10-24 NOTE — Anesthesia Post-Procedure Evaluation (Signed)
Anesthesia Post-op Note    Patient: Fred Garcia  MRN: [id number]  Birthdate: 12-Nov-1955  Date of evaluation: 10/24/2014  Time:  1:53 PM     Procedure(s) Performed:     Last Vitals: BP 130/80 mmHg   Pulse 79   Temp(Src) 98.6 ??F (37 ??C)   Resp 16   Ht 5\' 10"  (1.778 m)   Wt 242 lb (109.77 kg)   BMI 34.72 kg/m2   SpO2 92%    Aldrete Phase I: Aldrete Score: 8    Aldrete Phase II: Aldrete Score: 10    Anesthesia Post Evaluation    Final anesthesia type: general  Patient location during evaluation: PACU  Level of consciousness: awake and alert  Airway patency: patent  Nausea & Vomiting: no nausea and no vomiting  Complications: no  Cardiovascular status: hemodynamically stable  Respiratory status: spontaneous ventilation and room air  Hydration status: stable        Evelean Bigler A Karma Hiney, MD  1:53 PM

## 2014-10-24 NOTE — Op Note (Signed)
SURGEON: Midge Momon, D.O.   ASSISTANT: None.   PREOPERATIVE DIAGNOSIS: (1) Subacromial impingement, left shoulder (2) rotator cuff tear  POSTOPERATIVE DIAGNOSES: (1) Subacromial impingement, leftshoulder. (2)   Partial thickness rotator cuff tear. (3) Anterior and posterior labral tear.   OPERATION: left shoulder arthroscopy with subacromial arch decompression.   (2) Labral debridement. (3) Debridement of partial thickness rotator cuff   tear   ANESTHESIA: General.   ESTIMATED BLOOD LOSS: Minimal.   COMPLICATIONS: None.   OPERATIVE PROCEDURE: The patient was taken to the operative suite and was   given a general anesthesia. The patient was positioned in the lateral   decubitus position with a beanbag and then prepped and draped the arm in a   sterile fashion.   I outlined an incision along the lateral left shoulder. Hung 10 pounds of   traction from theleft arm, outlined the incisions along the acromial border,   1 posterolateral and lateral, and 1 anterior. I placed a blunt trocar within   the glenohumeral joint and carried out carried out a diagnostic arthroscopy.   The patient had evidence of no significant articular cartilage damage in   either the glenoid or humeral head. There was evidence of small tear   involving the anterior and posterior labrum, no abnormalities of the biceps   tendon. Biceps was in normal condition.   Rotator cuff from the undersurface showed undersurface fraying, I established   the anterior working portal at the rotator cuff interval and debrided the   labral tear, and the pt rtc tear using the 4-0 shaver. I then removed all fluid from the shoulder joint and went into   the subacromial space and completed the complete subacromial bursectomy.   Then using an ArthroCare electrode,   I outlined the acromial edges using ArthroCare electrode.  I then  smoothed the   anterior and inferior acromion using a 5-0 burr, performed an acromioplastysmoothing to a stable   Type 1 contour. I    flattened the acromial arch. No other abnormalities noted.   I then removed all fluid from the shoulder joint and closed the incision with   4-0 Prolene in a horizontal mattress-type fashion. Sterile dressing was   placed on the wound. The patient recovered in the recovery room without   difficulty.

## 2014-11-06 ENCOUNTER — Inpatient Hospital Stay: Attending: Orthopaedic Surgery | Primary: Family Medicine

## 2014-11-06 ENCOUNTER — Ambulatory Visit: Admit: 2014-11-06 | Primary: Family Medicine

## 2014-11-06 ENCOUNTER — Encounter

## 2014-11-06 ENCOUNTER — Ambulatory Visit
Admit: 2014-11-06 | Discharge: 2014-11-06 | Payer: PRIVATE HEALTH INSURANCE | Attending: Orthopaedic Surgery | Primary: Family Medicine

## 2014-11-06 DIAGNOSIS — M75122 Complete rotator cuff tear or rupture of left shoulder, not specified as traumatic: Secondary | ICD-10-CM

## 2014-11-06 NOTE — Patient Instructions (Signed)
Rotator Cuff: Exercises  Your Care Instructions  Here are some examples of typical rehabilitation exercises for your condition. Start each exercise slowly. Ease off the exercise if you start to have pain.  Your doctor or physical therapist will tell you when you can start these exercises and which ones will work best for you.  How to do the exercises  Pendulum swing    Note: If you have pain in your back, do not do this exercise.  1. Hold on to a table or the back of a chair with your good arm. Then bend forward a little and let your sore arm hang straight down. This exercise does not use the arm muscles. Rather, use your legs and your hips to create movement that makes your arm swing freely.  2. Use the movement from your hips and legs to guide the slightly swinging arm back and forth like a pendulum (or elephant trunk). Then guide it in circles that start small (about the size of a dinner plate). Make the circles a bit larger each day, as your pain allows.  3. Do this exercise for 5 minutes, 5 to 7 times each day.  4. As you have less pain, try bending over a little farther to do this exercise. This will increase the amount of movement at your shoulder.  Posterior stretching exercise    1. Hold the elbow of your injured arm with your other hand.  2. Use your hand to pull your injured arm gently up and across your body. You will feel a gentle stretch across the back of your injured shoulder.  3. Hold for at least 15 to 30 seconds. Then slowly lower your arm.  4. Repeat 2 to 4 times.  Up-the-back stretch    Note: Your doctor or physical therapist may want you to wait to do this stretch until you have regained most of your range of motion and strength. You can do this stretch in different ways. Hold any of these stretches for at least 15 to 30 seconds. Repeat them 2 to 4 times.  1. Put your hand in your back pocket. Let it rest there to stretch your shoulder.  2. With your other hand, hold your injured arm (palm  outward) behind your back by the wrist. Pull your arm up gently to stretch your shoulder.  3. Next, put a towel over your other shoulder. Put the hand of your injured arm behind your back. Now hold the back end of the towel. With the other hand, hold the front end of the towel in front of your body. Pull gently on the front end of the towel. This will bring your hand farther up your back to stretch your shoulder.  Overhead stretch    1. Standing about an arm's length away, grasp onto a solid surface. You could use a countertop, a doorknob, or the back of a sturdy chair.  2. With your knees slightly bent, bend forward with your arms straight. Lower your upper body, and let your shoulders stretch.  3. As your shoulders are able to stretch farther, you may need to take a step or two backward.  4. Hold for at least 15 to 30 seconds. Then stand up and relax. If you had stepped back during your stretch, step forward so you can keep your hands on the solid surface.  5. Repeat 2 to 4 times.  Shoulder flexion (lying down)    Note: To make a wand for this exercise,   use a piece of PVC pipe or a broom handle with the broom removed. Make the wand about a foot wider than your shoulders.  1. Lie on your back, holding a wand with both hands. Your palms should face down as you hold the wand.  2. Keeping your elbows straight, slowly raise your arms over your head. Raise them until you feel a stretch in your shoulders, upper back, and chest.  3. Hold for 15 to 30 seconds.  4. Repeat 2 to 4 times.  Shoulder rotation (lying down)    Note: To make a wand for this exercise, use a piece of PVC pipe or a broom handle with the broom removed. Make the wand about a foot wider than your shoulders.  1. Lie on your back. Hold a wand with both hands with your elbows bent and palms up.  2. Keep your elbows close to your body, and move the wand across your body toward the sore arm.  3. Hold for 8 to 12 seconds.  4. Repeat 2 to 4 times.  Wall  climbing (to the side)    Note: Avoid any movement that is straight to your side, and be careful not to arch your back. Your arm should stay about 30 degrees to the front of your side.  1. Stand with your side to a wall so that your fingers can just touch it at an angle about 30 degrees toward the front of your body.  2. Walk the fingers of your injured arm up the wall as high as pain permits. Try not to shrug your shoulder up toward your ear as you move your arm up.  3. Hold that position for a count of at least 15 to 20.  4. Walk your fingers back down to the starting position.  5. Repeat at least 2 to 4 times. Try to reach higher each time.  Wall climbing (to the front)    Note: During this stretching exercise, be careful not to arch your back.  1. Face a wall, and stand so your fingers can just touch it.  2. Keeping your shoulder down, walk the fingers of your injured arm up the wall as high as pain permits. (Don't shrug your shoulder up toward your ear.)  3. Hold your arm in that position for at least 15 to 30 seconds.  4. Slowly walk your fingers back down to where you started.  5. Repeat at least 2 to 4 times. Try to reach higher each time.  Shoulder blade squeeze    1. Stand with your arms at your sides, and squeeze your shoulder blades together. Do not raise your shoulders up as you squeeze.  2. Hold 6 seconds.  3. Repeat 8 to 12 times.  Scapular exercise: Arm reach    1. Lie flat on your back. This exercise is a very slight motion that starts with your arms raised (elbows straight, arms straight).  2. From this position, reach higher toward the sky or ceiling. Keep your elbows straight. All motion should be from your shoulder blade only.  3. Relax your arms back to where you started.  4. Repeat 8 to 12 times.  Arm raise to the side    Note: During this strengthening exercise, your arm should stay about 30 degrees to the front of your side.  1. Slowly raise your injured arm to the side, with your thumb  facing up. Raise your arm 60 degrees at the most (shoulder level is   90 degrees).  2. Hold the position for 3 to 5 seconds. Then lower your arm back to your side. If you need to, bring your "good" arm across your body and place it under the elbow as you lower your injured arm. Use your good arm to keep your injured arm from dropping down too fast.  3. Repeat 8 to 12 times.  4. When you first start out, don't hold any extra weight in your hand. As you get stronger, you may use a 1-pound to 2-pound dumbbell or a small can of food.  Shoulder flexor and extensor exercise    Note: These are isometric exercises. That means you contract your muscles without actually moving.  ?? Push forward (flex): Stand facing a wall or doorjamb, about 6 inches or less back. Hold your injured arm against your body. Make a closed fist with your thumb on top. Then gently push your hand forward into the wall with about 25% to 50% of your strength. Don't let your body move backward as you push. Hold for about 6 seconds. Relax for a few seconds. Repeat 8 to 12 times.  ?? Push backward (extend): Stand with your back flat against a wall. Your upper arm should be against the wall, with your elbow bent 90 degrees (your hand straight ahead). Push your elbow gently back against the wall with about 25% to 50% of your strength. Don't let your body move forward as you push. Hold for about 6 seconds. Relax for a few seconds. Repeat 8 to 12 times.  Scapular exercise: Wall push-ups    Note: This exercise is best done with your fingers somewhat turned out, rather than straight up and down.  1. Stand facing a wall, about 12 inches to 18 inches away.  2. Place your hands on the wall at shoulder height.  3. Slowly bend your elbows and bring your face to the wall. Keep your back and hips straight.  4. Push back to where you started.  5. Repeat 8 to 12 times.  6. When you can do this exercise against a wall comfortably, you can try it against a counter. You can  then slowly progress to the end of a couch, then to a sturdy chair, and finally to the floor.  Scapular exercise: Retraction    Note: For this exercise, you will need elastic exercise material, such as surgical tubing or Thera-Band.  1. Put the band around a solid object at about waist level. (A bedpost will work well.) Each hand should hold an end of the band.  2. With your elbows at your sides and bent to 90 degrees, pull the band back. Your shoulder blades should move toward each other. Then move your arms back where you started.  3. Repeat 8 to 12 times.  4. If you have good range of motion in your shoulders, try this exercise with your arms lifted out to the sides. Keep your elbows at a 90-degree angle. Raise the elastic band up to about shoulder level. Pull the band back to move your shoulder blades toward each other. Then move your arms back where you started.  Internal rotator strengthening exercise    1. Start by tying a piece of elastic exercise material to a doorknob. You can use surgical tubing or Thera-Band.  2. Stand or sit with your shoulder relaxed and your elbow bent 90 degrees. Your upper arm should rest comfortably against your side. Squeeze a rolled towel between your elbow and your   body for comfort. This will help keep your arm at your side.  3. Hold one end of the elastic band in the hand of the painful arm.  4. Slowly rotate your forearm toward your body until it touches your belly. Slowly move it back to where you started.  5. Keep your elbow and upper arm firmly tucked against the towel roll or at your side.  6. Repeat 8 to 12 times.  External rotator strengthening exercise    1. Start by tying a piece of elastic exercise material to a doorknob. You can use surgical tubing or Thera-Band. (You may also hold one end of the band in each hand.)  2. Stand or sit with your shoulder relaxed and your elbow bent 90 degrees. Your upper arm should rest comfortably against your side. Squeeze a rolled  towel between your elbow and your body for comfort. This will help keep your arm at your side.  3. Hold one end of the elastic band with the hand of the painful arm.  4. Start with your forearm across your belly. Slowly rotate the forearm out away from your body. Keep your elbow and upper arm tucked against the towel roll or the side of your body until you begin to feel tightness in your shoulder. Slowly move your arm back to where you started.  5. Repeat 8 to 12 times.  Follow-up care is a key part of your treatment and safety. Be sure to make and go to all appointments, and call your doctor if you are having problems. It's also a good idea to know your test results and keep a list of the medicines you take.   Where can you learn more?   Go to [email or web address] and sign in to your MyChart account. Enter J005 in the Search Health Information box to learn more about ???Rotator Cuff: Exercises.???    If you do not have an account, please click on the ???Sign Up Now??? link.     ?? 2006-2015 [employer]. Care instructions adapted under license by [facility]. This care instruction is for use with your licensed healthcare professional. If you have questions about a medical condition or this instruction, always ask your healthcare professional. [employer] disclaims any warranty or liability for your use of this information.  Content Version: 10.6.465758; Current as of: Feb 07, 2014

## 2014-11-06 NOTE — Progress Notes (Signed)
Fred CaperWilliam Garcia is here for follow-up after right shoulder arthroscopy. Findings at surgery: (1) Subacromial impingement, leftshoulder. (2) Partial thickness rotator cuff tear. (3) Anterior and posterior labral tear.   Pain is controlled with current analgesics.  Medication(s) being used: narcotic analgesics including hydrocodone/acetaminophen (Lorcet, Lortab, Norco, Vicodin).  The patient denies fever, wound drainage, increasing redness, pus, increasing pain, increasing swelling. Post op problems reported: none.  He is ambulating with slightly antalgic gait.     Shoulder exam - The incisions are clean, dry and intact.  right diminished ROM with tenderness on palpation.   Motor and sensory exam is grossly intact in B/L upper extremities.    Special test results are as follow:  Impingement negative, Hawkins negative, Speeds negative, Apprehension negative, Obrien negative, Load Shiftnegative, Sperling manuver negative, Cross arm test negative.      Encounter Diagnoses   Name Primary?   ??? Complete tear of left rotator cuff Yes   ??? Shoulder impingement, left    ??? Biceps tendonitis, left    ??? Left shoulder pain        Plan:    The patient will continue with gentle ROM exercises and being activities as tolerated.  The patient is not being referred to physical therapy.  Sling will be used for comfort ONLY.  Patient is to continue analgesics and needed and use ice for pain.  We will see the pain back in 4 weeks time for repeat evaluation.

## 2014-12-04 ENCOUNTER — Ambulatory Visit
Admit: 2014-12-04 | Discharge: 2014-12-04 | Payer: PRIVATE HEALTH INSURANCE | Attending: Orthopaedic Surgery | Primary: Family Medicine

## 2014-12-04 DIAGNOSIS — S43432A Superior glenoid labrum lesion of left shoulder, initial encounter: Secondary | ICD-10-CM

## 2014-12-04 NOTE — Progress Notes (Signed)
Fred CaperWilliam Garcia is here for follow-up after right shoulder arthroscopy. Findings at surgery: (1) Subacromial impingement, leftshoulder. (2) Partial thickness rotator cuff tear. (3) Anterior and posterior labral tear.   Pain is controlled with current analgesics.  Medication(s) being used: narcotic analgesics including hydrocodone/acetaminophen (Lorcet, Lortab, Norco, Vicodin).  The patient denies fever, wound drainage, increasing redness, pus, increasing pain, increasing swelling. Post op problems reported: none.  He is ambulating with slightly antalgic gait.     Shoulder exam - The incisions are clean, dry and intact.  Right improved ROM 140/35/T12 with mild tenderness on palpation.   Motor and sensory exam is grossly intact in B/L upper extremities.    Special test results are as follow:  Impingement negative, Hawkins negative, Speeds negative, Apprehension negative, Obrien negative, Load Shiftnegative, Sperling manuver negative, Cross arm test negative.      Encounter Diagnoses   Name Primary?   ??? Labral tear of shoulder, left, initial encounter Yes   ??? Complete tear of left rotator cuff    ??? Shoulder impingement, left        Plan:  He will continue with activities as tolerated and finish out PT.  I will see him back on a PRN basis.

## 2016-02-24 DIAGNOSIS — E1129 Type 2 diabetes mellitus with other diabetic kidney complication: Secondary | ICD-10-CM | POA: Insufficient documentation

## 2016-02-24 DIAGNOSIS — E119 Type 2 diabetes mellitus without complications: Secondary | ICD-10-CM | POA: Diagnosis not present

## 2016-02-24 DIAGNOSIS — Z125 Encounter for screening for malignant neoplasm of prostate: Secondary | ICD-10-CM | POA: Diagnosis not present

## 2016-02-24 DIAGNOSIS — Z0000 Encounter for general adult medical examination without abnormal findings: Secondary | ICD-10-CM | POA: Diagnosis not present

## 2016-02-24 DIAGNOSIS — R05 Cough: Secondary | ICD-10-CM | POA: Diagnosis not present

## 2016-02-24 DIAGNOSIS — E7800 Pure hypercholesterolemia, unspecified: Secondary | ICD-10-CM | POA: Diagnosis not present

## 2016-02-24 DIAGNOSIS — R809 Proteinuria, unspecified: Secondary | ICD-10-CM | POA: Insufficient documentation

## 2016-04-27 ENCOUNTER — Encounter: Payer: Self-pay | Admitting: Licensed Clinical Social Worker

## 2016-04-27 ENCOUNTER — Ambulatory Visit (INDEPENDENT_AMBULATORY_CARE_PROVIDER_SITE_OTHER): Payer: PPO | Admitting: Licensed Clinical Social Worker

## 2016-04-27 DIAGNOSIS — F32A Depression, unspecified: Secondary | ICD-10-CM | POA: Insufficient documentation

## 2016-04-27 DIAGNOSIS — F419 Anxiety disorder, unspecified: Principal | ICD-10-CM

## 2016-04-27 DIAGNOSIS — F418 Other specified anxiety disorders: Secondary | ICD-10-CM | POA: Diagnosis not present

## 2016-04-27 DIAGNOSIS — F329 Major depressive disorder, single episode, unspecified: Secondary | ICD-10-CM
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
## 2016-05-13 ENCOUNTER — Ambulatory Visit: Payer: PPO | Admitting: Licensed Clinical Social Worker

## 2016-05-19 ENCOUNTER — Ambulatory Visit (INDEPENDENT_AMBULATORY_CARE_PROVIDER_SITE_OTHER): Payer: PPO | Admitting: Licensed Clinical Social Worker

## 2016-05-19 DIAGNOSIS — F329 Major depressive disorder, single episode, unspecified: Secondary | ICD-10-CM

## 2016-05-19 DIAGNOSIS — F32A Depression, unspecified: Secondary | ICD-10-CM

## 2016-05-19 DIAGNOSIS — F418 Other specified anxiety disorders: Secondary | ICD-10-CM | POA: Diagnosis not present

## 2016-05-19 DIAGNOSIS — F419 Anxiety disorder, unspecified: Principal | ICD-10-CM

## 2016-05-19 NOTE — Progress Notes (Signed)
   THERAPIST PROGRESS NOTE  Session Time: 10 AM to 10:55 AM  Participation Level: Active  Behavioral Response: CasualAlertEuthymic  Type of Therapy: Individual Therapy  Treatment Goals addressed:  patient work on strategies to better manage anger to better handle frustration and increase positive experiences in his life  Interventions: CBT, DBT, Solution Focused, Supportive, Anger Management Training and Reframing  Summary: Marcus Gentry. is a 60 y.o. male who presents with relating that everything is good, but that  stupid things set him off. He recognizes that he is inpatient and in a hurry. Identifies trigger thoughts that lead to impatience. Related that he has good relationship with girlfriend's teenage son but also increased conflict related to dealing with him going to the teenage years. He keeps busy but has days when he closes the blinds and watch TV and is unmotivated. Recognizes that he has to work on patience. He has days when the glass is half full and other days when it is half empty. Shared that his week and have a lot of down time with not a lot going on and is the guy who takes care of all the responsibilities. He is making efforts to engage in positive activities as he realizes fortunate with free time because retired. He also has started to meet people and has participated in some fun activities with them. Reviewed anger management worksheet and recognizes that he has issues with anger that he needs to work on. Completed treatment plan and focuses on strategies to manage anger so that he has more fun in life and less incidences of anger.  Suicidal/Homicidal: No  Therapist Response: Encouraged patient to challenge trigger thought. Explained that our thoughts are not always telling us the truth and to challenge them for their inaccuracies. Encouraged patient to write a new narrative for incidents that are frustrating that will help him better manage them. Encouraged him to have  perspective not to let little incidents have such a big impact on his mood and that getting frustrated doesn't help that only increases negative mood. Reviewed specific perspectives as alternatives to help manage frustrations. Encouraged patient to implement positive activities in the day that will help increase positive emotions and recognizes even small incidences of joy during the day. Therapist explained that he has to slow down the process between emotion into action to better manage them. Reviewed anger management worksheet and explained sources of anger include unmet needs and things we cannot control and reviewed effective strategies to manage anger. Completed treatment plan and patient will work on anger management to be happier and decrease incidents of anger ces. Write a new narrative. Challlenge perspective. He has to slow down emotions. Slow down emotions to better able to manage. Encouraged patient to continue to build support system  Plan: Return again in 2 weeks.2. Patient challenge perspective that leads to negative emotions.3. Patient work on slowing down process of experiencing emotions to action to better control emotions  Diagnosis: Axis I: Anxiety and Depression    Axis II: No diagnosis    Bowman,Mary A, LCSW 05/19/2016

## 2016-06-02 ENCOUNTER — Ambulatory Visit (INDEPENDENT_AMBULATORY_CARE_PROVIDER_SITE_OTHER): Payer: PPO | Admitting: Licensed Clinical Social Worker

## 2016-06-02 DIAGNOSIS — F419 Anxiety disorder, unspecified: Principal | ICD-10-CM

## 2016-06-02 DIAGNOSIS — F32A Depression, unspecified: Secondary | ICD-10-CM

## 2016-06-02 DIAGNOSIS — F418 Other specified anxiety disorders: Secondary | ICD-10-CM

## 2016-06-02 DIAGNOSIS — F329 Major depressive disorder, single episode, unspecified: Secondary | ICD-10-CM

## 2016-06-02 NOTE — Progress Notes (Signed)
   THERAPIST PROGRESS NOTE  Session Time: 9:05 AM to 9:55 AM  Participation Level: Active  Behavioral Response: CasualAlertEuthymic  Type of Therapy: Individual Therapy  Treatment Goals addressed:  patient work on strategies to better manage anger to better handle frustration and increase positive experiences in his life  Interventions: Solution Focused, Supportive, Anger Management Training, Family Systems and Reframing  Summary: Tai Henness. is a 60 y.o. male who he is out of his depressive slump and described going on a motorcycle trip and spending time with a friend once a week. He still have issues with managing frustration and irritability. He relates that small things can bother him. He is always had a good relationship with his stepson who is in ninth grade but since started school he is disrespectful and thinks that patient does not know anything. Patient recognizes that adolescent age can be  challenging for a parent that his strategy also has been to disengage from interacting so that he will become irritable. Relates that otherwise life is good. He guessed he was depressed. Described issues with daughter who lives with them and son in the apartment. He said it'll be little things like her coughing for 5 months that will get on his nerves. Relates that his girlfriend is a caretaker and helps out her kids but him as well. He said his relationship with stepdaughter is that he has never told her to do anything. Mom does everything. He feels that stepdaughter is 25 and needs to get out on her own. His girlfriend also agrees that she is taking her time in having her leave. Recognizes he lacks patience. Discussed with tapering to the table and through the ups and downs no be a lot of memories. Patient recognizes this.     Suicidal/Homicidal: No  Therapist Response: Therapist reviewed progress and update on symptoms. Discussed alternative perspectives that will help decrease irritability  and help with patience. Explored triggers that cause irritability and frustration. Work with patient on developing an alternative perspective and encouraged this perspective by explaining it is not helpful and not worth putting energy and time into to negative thoughts. Validated patient on his feelings and the limited parental authority he has with step kids and the impacted his hat on choices he has made. Discussed good parenting strategies including encouraging kids to take care of themselves and be independent. Not good that she is not taking care of herself. Humor. Discussed about parenting and it what will be helpful. Gave patient positive feedback about engaging more in positive activities and provided education and relating that a general strategy for enhancing positive emotions as to schedule pleasurable activities. Therapist encouraged patient and open communication with girlfriend in terms of his frustrations as a healthy relationship skill. Shared that getting he needs it from wrong and things that trigger him as a helpful strategy.  Plan: Return again in 2 weeks.2. Patient uses reframing and behavioral strategies to help with irritability and frustration.3. Patient continue to engage in positive experiences to help improve mood  Diagnosis: Axis I:  Anxiety and Depression    Axis II: No diagnosis    Bowman,Mary A, LCSW 06/02/2016

## 2016-06-21 DIAGNOSIS — R740 Nonspecific elevation of levels of transaminase and lactic acid dehydrogenase [LDH]: Secondary | ICD-10-CM | POA: Diagnosis not present

## 2016-06-21 DIAGNOSIS — E1165 Type 2 diabetes mellitus with hyperglycemia: Secondary | ICD-10-CM | POA: Diagnosis not present

## 2016-06-21 DIAGNOSIS — E118 Type 2 diabetes mellitus with unspecified complications: Secondary | ICD-10-CM | POA: Diagnosis not present

## 2016-06-23 ENCOUNTER — Ambulatory Visit (INDEPENDENT_AMBULATORY_CARE_PROVIDER_SITE_OTHER): Payer: PPO | Admitting: Licensed Clinical Social Worker

## 2016-06-23 DIAGNOSIS — F418 Other specified anxiety disorders: Secondary | ICD-10-CM | POA: Diagnosis not present

## 2016-06-23 DIAGNOSIS — F419 Anxiety disorder, unspecified: Principal | ICD-10-CM

## 2016-06-23 DIAGNOSIS — F32A Depression, unspecified: Secondary | ICD-10-CM

## 2016-06-23 DIAGNOSIS — F329 Major depressive disorder, single episode, unspecified: Secondary | ICD-10-CM

## 2016-06-23 NOTE — Progress Notes (Signed)
   THERAPIST PROGRESS NOTE  Session Time: 11 AM to 11:55 AM  Participation Level: Active  Behavioral Response: CasualAlertEuthymic  Type of Therapy: Individual Therapy  Treatment Goals addressed: patient work on strategies to better manage anger to better handle frustration and increase positive experiences in his life  Interventions: CBT, Solution Focused, Supportive, Anger Management Training, Family Systems and Reframing  Summary: Marcus Gentry. is a 60 y.o. male who presents with doing "great and not problems". He still says he is getting frustrated over stupid things and doesn't want to be like this. The older he gets crankier he get. He relates that he should be traveling but instead spending the day cleaning and organizing. He asked why he is like this but then as he explored with therapist he said has always been a neat freak. Explored as well that build up frustration is occurring as he is the one who cleans the house and also would like to change current living situation. Living in the apartment is stressful and living with girlfriend's kids can be stressful. Discussed engaging in positive activities or changes focus to help with attitude and help her frustrations. Discussed taking steps to change family system to one that is more healthy functioning. Encouraged patient if he hesitates in making changes to at least talk with girlfriend about future plans for change that will help him to tolerate current frustrations. Discussed the communication strategy where patient consciously lets things go  To help diffuse his frustration.  Suicidal/Homicidal: No  Therapist Response: Reviewed progress and reviewed things patient can do to not react so quickly over insignificant things. Worked on self talk that will help him to not react. Encouraged patient not to entertain the negative thoughts but to let them go. Discussed recognizing signs when he starts to feel frustrated and this recognition of  emotions escalating will help him have more control over emotions so he has more options on how to react. Skills as he becomes more conscious of his reaction and making healthier choices to notice afterword the insignificant of the issue to motivate him to change his behaviors. Identified trigger of build up stress and household. Discussed he has the option to make changes in how the family is functioning. Additionally discussed letting go of things he can't control and recognizing limited amount of control in situations to help him with frustration. Discussed long-term problem solving to manage frustrations with current living situation. Encouraged patient to focus on positives in his life and to focus on engaging in the activities he enjoys during the day to help with attitude.  Plan: Return again in 4 weeks.2.patient continue to learn and apply skills to manage frustrations stressors   Diagnosis: Axis I: Anxiety and Depression    Axis II: No diagnosis    Riddick Nuon A, LCSW 06/23/2016

## 2016-06-28 DIAGNOSIS — Z23 Encounter for immunization: Secondary | ICD-10-CM | POA: Diagnosis not present

## 2016-06-28 DIAGNOSIS — E119 Type 2 diabetes mellitus without complications: Secondary | ICD-10-CM | POA: Diagnosis not present

## 2016-06-28 DIAGNOSIS — E1129 Type 2 diabetes mellitus with other diabetic kidney complication: Secondary | ICD-10-CM | POA: Diagnosis not present

## 2016-06-28 DIAGNOSIS — R809 Proteinuria, unspecified: Secondary | ICD-10-CM | POA: Diagnosis not present

## 2016-06-28 DIAGNOSIS — E7800 Pure hypercholesterolemia, unspecified: Secondary | ICD-10-CM | POA: Diagnosis not present

## 2016-07-21 ENCOUNTER — Ambulatory Visit (INDEPENDENT_AMBULATORY_CARE_PROVIDER_SITE_OTHER): Payer: PPO | Admitting: Licensed Clinical Social Worker

## 2016-07-21 DIAGNOSIS — F419 Anxiety disorder, unspecified: Principal | ICD-10-CM

## 2016-07-21 DIAGNOSIS — F418 Other specified anxiety disorders: Secondary | ICD-10-CM

## 2016-07-21 DIAGNOSIS — F329 Major depressive disorder, single episode, unspecified: Secondary | ICD-10-CM

## 2016-07-21 DIAGNOSIS — F32A Depression, unspecified: Secondary | ICD-10-CM

## 2016-07-21 NOTE — Progress Notes (Signed)
THERAPIST PROGRESS NOTE  Session Time: 10:30 AMto 11:25 AM  Participation Level: Active  Behavioral Response: CasualAlertEuthymic  Type of Therapy: Individual Therapy  Treatment Goals addressed:  patient work on strategies to better manage anger to better handle frustration and increase positive experiences in his life  Interventions: DBT, Solution Focused, Strength-based, Anger Management Training, Family Systems, Reframing and Other: Positive psychology  Summary: Shamon Ogan. is a 60 y.o. male who presents continuing problems with irritability and wishes he wasn't so irritable. 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 Mathews Robinsons. He has become a support system for her when outs including cooking and cleaning because he is the one not working and in retirement. He doesn't mind but also not very satisfying. Described in more detail his frustrations at the house including that girlfriend's kids do not help out and because girlfriend is kind but also enabling it has impacted years and personality. At times he is able to enjoy her company but at other times it starts to irritate him. Discussed talking to girlfriend about parenting and how it impacts the relationship but patient chooses not to speak up and feels that is the better option for him. He is able to have many different outlets and takes advantage of them and this helps him in managing stress and mood. He relates that he did not get into a partnership with both his wife and kids and that if they invest and put roots down it will only be with his girlfriend. Discussed with therapist that it makes sense that they need to be on their own and take care of themselves as they become adults. Patient describes feeling fortunate to have the lifestyle now that he is retired and therapist encouraged him to be more specific and identify 3 things he is grateful for daily.   Suicidal/Homicidal: No  Therapist Response: Reviewed patient's symptoms and  progress.explored more in depth patient's triggers. Therapist validated patient on his feelings and explored options available to address stress related to his girlfriend's kids. Reframed with patient to recognize that having the kids being on her own is healthy for their development. Provided insight with patient that often when he snapped because things aren't going our way and everything life isn't going go away even if it's not fair. The more we come to acceptance of that the easier it is for [place] to get out of a state of frustration. Discussed that if we aren't emotional mind we are disconnected to our rational problem solving logic mind and that we have to calm down so that we again can function from a more logical perspective. Discussed how patient's current lifestyle is helpful and stress management and provided positive feedback for patient exploring his different interests. Discussed the use of gratitude and that it has a multiple positive outcomes, like increasing well-being, improving her relationships, making [place] more optimistic even to help find more meaning in her life. Encouraged patient to find 3 things he is grateful for daily and to find different ones that he is grateful for. Discussed paying attention and being more present to experience moments and stated that bring him joy. Discussed the action urge to comes with emotions and that one has an option to write to the emotion or affect opposite emotion to help one change one's mood state. Provided supportive and strength-based interventions.    Plan: Return again in 4 weeks.2. patient continued to gain insight and apply strategies to help with anger and frustration as  well as have more positive experiences in his life. 3.patient identify 3 things he is grateful for daily   Diagnosis: Axis I:  Anxiety and Depression    Axis II: No diagnosis    Alee Katen A, LCSW 07/21/2016

## 2016-08-24 ENCOUNTER — Ambulatory Visit (INDEPENDENT_AMBULATORY_CARE_PROVIDER_SITE_OTHER): Payer: PPO | Admitting: Licensed Clinical Social Worker

## 2016-08-24 DIAGNOSIS — F32A Depression, unspecified: Secondary | ICD-10-CM

## 2016-08-24 DIAGNOSIS — F329 Major depressive disorder, single episode, unspecified: Secondary | ICD-10-CM

## 2016-08-24 DIAGNOSIS — F419 Anxiety disorder, unspecified: Principal | ICD-10-CM

## 2016-08-24 DIAGNOSIS — F418 Other specified anxiety disorders: Secondary | ICD-10-CM | POA: Diagnosis not present
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
## 2016-09-29 ENCOUNTER — Ambulatory Visit (INDEPENDENT_AMBULATORY_CARE_PROVIDER_SITE_OTHER): Payer: PPO | Admitting: Licensed Clinical Social Worker

## 2016-09-29 DIAGNOSIS — F329 Major depressive disorder, single episode, unspecified: Secondary | ICD-10-CM

## 2016-09-29 DIAGNOSIS — F419 Anxiety disorder, unspecified: Principal | ICD-10-CM

## 2016-09-29 DIAGNOSIS — F32A Depression, unspecified: Secondary | ICD-10-CM

## 2016-09-29 DIAGNOSIS — F418 Other specified anxiety disorders: Secondary | ICD-10-CM

## 2016-09-29 NOTE — Progress Notes (Signed)
   THERAPIST PROGRESS NOTE  Session Time: 11 AM to 11:55 AM  Participation Level: Active  Behavioral Response: CasualAlertEuthymic  Type of Therapy: Individual Therapy  Treatment Goals addressed: patient work on strategies to better manage anger to better handle frustration and increase positive experiences in his life  Interventions: Solution Focused, Strength-based, Supportive, Family Systems and Other: Healthy interpersonal relationships  Summary: Marcus Gentry. is a 61 y.o. male who presents with fed up with home situation. He relates specific stressors related to his relationship, that she is argumentative and snappy and with stress at her work it's gotten worse. He feels he's been supportive and feels unappreciative. She is wrapped up at her job and they have not spent time together. He feels like he does all the household chores in the house. He has spoken up but feels he's not listen to. He also feels and has feelings her brought up that it creates more of an argument. Discussed making her more aware of changes she needs to maid and he pointed out that she always has been argumentative and and severity depends on what stressors she is dealing with. He thinks he has been thinking about this for a while and hopes things will improve now that her work will be changing and should be less stressful.   Suicidal/Homicidal: No  Therapist Response: Help patient to process through feelings in session related to stressors at home and his relationship and discussed how expressing feelings is a healthy way to manage feelings. Encouraged patient to implement healthy relationship skills including being open and communicating how he is feeling in the relationship in order to work on problems in the relationship. Encourage communication so girlfriend is more aware of her attitude and behaviors that are impacting relationship and awareness will give her an opportunity to make changes. Encouraged patient  to put effort into activities that will enhance the relationship and that will help in his over all well-being that will help with stress management. Discussed pros and cons of relationship to encourage patient to have more insight to his feelings about the relationship. Discussed how stress can negatively impact relationships.  Plan: Return again in 4 weeks.2. Patient continue to gain insight to stress management and management of his frustrations and implement an to life situations.  Diagnosis: Axis I: Anxiety and Depression    Axis II: No diagnosis    Bowman,Mary A, LCSW 09/29/2016

## 2016-10-25 DIAGNOSIS — E7800 Pure hypercholesterolemia, unspecified: Secondary | ICD-10-CM | POA: Diagnosis not present

## 2016-10-25 DIAGNOSIS — E119 Type 2 diabetes mellitus without complications: Secondary | ICD-10-CM | POA: Diagnosis not present

## 2016-10-27 ENCOUNTER — Ambulatory Visit: Payer: PPO | Admitting: Licensed Clinical Social Worker

## 2016-11-11 DIAGNOSIS — E1129 Type 2 diabetes mellitus with other diabetic kidney complication: Secondary | ICD-10-CM | POA: Diagnosis not present

## 2016-11-11 DIAGNOSIS — R809 Proteinuria, unspecified: Secondary | ICD-10-CM | POA: Diagnosis not present

## 2016-11-11 DIAGNOSIS — E7800 Pure hypercholesterolemia, unspecified: Secondary | ICD-10-CM | POA: Diagnosis not present

## 2017-01-16 DIAGNOSIS — R05 Cough: Secondary | ICD-10-CM | POA: Diagnosis not present

## 2017-01-16 DIAGNOSIS — R52 Pain, unspecified: Secondary | ICD-10-CM | POA: Diagnosis not present

## 2017-01-23 DIAGNOSIS — R05 Cough: Secondary | ICD-10-CM | POA: Diagnosis not present

## 2017-01-23 DIAGNOSIS — J45901 Unspecified asthma with (acute) exacerbation: Secondary | ICD-10-CM | POA: Diagnosis not present

## 2017-01-31 DIAGNOSIS — E119 Type 2 diabetes mellitus without complications: Secondary | ICD-10-CM | POA: Diagnosis not present

## 2017-02-01 DIAGNOSIS — E1129 Type 2 diabetes mellitus with other diabetic kidney complication: Secondary | ICD-10-CM | POA: Diagnosis not present

## 2017-02-01 DIAGNOSIS — Z794 Long term (current) use of insulin: Secondary | ICD-10-CM | POA: Diagnosis not present

## 2017-02-01 DIAGNOSIS — J40 Bronchitis, not specified as acute or chronic: Secondary | ICD-10-CM | POA: Diagnosis not present

## 2017-02-01 DIAGNOSIS — R809 Proteinuria, unspecified: Secondary | ICD-10-CM | POA: Diagnosis not present

## 2017-03-01 DIAGNOSIS — E7800 Pure hypercholesterolemia, unspecified: Secondary | ICD-10-CM | POA: Diagnosis not present

## 2017-03-01 DIAGNOSIS — R809 Proteinuria, unspecified: Secondary | ICD-10-CM | POA: Diagnosis not present

## 2017-03-01 DIAGNOSIS — Z0000 Encounter for general adult medical examination without abnormal findings: Secondary | ICD-10-CM | POA: Diagnosis not present

## 2017-03-01 DIAGNOSIS — E1129 Type 2 diabetes mellitus with other diabetic kidney complication: Secondary | ICD-10-CM | POA: Diagnosis not present

## 2017-03-14 DIAGNOSIS — I10 Essential (primary) hypertension: Secondary | ICD-10-CM | POA: Insufficient documentation

## 2017-03-14 DIAGNOSIS — E7800 Pure hypercholesterolemia, unspecified: Secondary | ICD-10-CM | POA: Diagnosis not present

## 2017-03-14 DIAGNOSIS — R809 Proteinuria, unspecified: Secondary | ICD-10-CM | POA: Diagnosis not present

## 2017-03-14 DIAGNOSIS — E1129 Type 2 diabetes mellitus with other diabetic kidney complication: Secondary | ICD-10-CM | POA: Diagnosis not present

## 2017-04-26 DIAGNOSIS — R062 Wheezing: Secondary | ICD-10-CM | POA: Diagnosis not present

## 2017-05-09 DIAGNOSIS — Z87891 Personal history of nicotine dependence: Secondary | ICD-10-CM | POA: Diagnosis not present

## 2017-05-10 DIAGNOSIS — J410 Simple chronic bronchitis: Secondary | ICD-10-CM | POA: Insufficient documentation

## 2017-07-13 DIAGNOSIS — I10 Essential (primary) hypertension: Secondary | ICD-10-CM | POA: Diagnosis not present

## 2017-07-13 DIAGNOSIS — E7800 Pure hypercholesterolemia, unspecified: Secondary | ICD-10-CM | POA: Diagnosis not present

## 2017-07-13 DIAGNOSIS — R809 Proteinuria, unspecified: Secondary | ICD-10-CM | POA: Diagnosis not present

## 2017-07-13 DIAGNOSIS — E1129 Type 2 diabetes mellitus with other diabetic kidney complication: Secondary | ICD-10-CM | POA: Diagnosis not present

## 2017-07-20 DIAGNOSIS — Z23 Encounter for immunization: Secondary | ICD-10-CM | POA: Diagnosis not present

## 2017-07-20 DIAGNOSIS — E7800 Pure hypercholesterolemia, unspecified: Secondary | ICD-10-CM | POA: Diagnosis not present

## 2017-07-20 DIAGNOSIS — Z0000 Encounter for general adult medical examination without abnormal findings: Secondary | ICD-10-CM | POA: Insufficient documentation

## 2017-07-20 DIAGNOSIS — I10 Essential (primary) hypertension: Secondary | ICD-10-CM | POA: Diagnosis not present

## 2017-07-20 DIAGNOSIS — J410 Simple chronic bronchitis: Secondary | ICD-10-CM | POA: Diagnosis not present

## 2017-07-20 DIAGNOSIS — R809 Proteinuria, unspecified: Secondary | ICD-10-CM | POA: Diagnosis not present

## 2017-07-20 DIAGNOSIS — E1129 Type 2 diabetes mellitus with other diabetic kidney complication: Secondary | ICD-10-CM | POA: Diagnosis not present

## 2017-08-31 DIAGNOSIS — M25552 Pain in left hip: Secondary | ICD-10-CM | POA: Diagnosis not present

## 2017-09-07 DIAGNOSIS — M25552 Pain in left hip: Secondary | ICD-10-CM | POA: Diagnosis not present

## 2017-09-07 DIAGNOSIS — G8929 Other chronic pain: Secondary | ICD-10-CM | POA: Diagnosis not present

## 2017-09-18 DIAGNOSIS — M25552 Pain in left hip: Secondary | ICD-10-CM | POA: Diagnosis not present

## 2017-09-18 DIAGNOSIS — R531 Weakness: Secondary | ICD-10-CM | POA: Diagnosis not present

## 2017-09-18 DIAGNOSIS — G8929 Other chronic pain: Secondary | ICD-10-CM | POA: Diagnosis not present

## 2017-09-25 DIAGNOSIS — G8929 Other chronic pain: Secondary | ICD-10-CM | POA: Diagnosis not present

## 2017-09-25 DIAGNOSIS — R531 Weakness: Secondary | ICD-10-CM | POA: Diagnosis not present

## 2017-09-25 DIAGNOSIS — M25552 Pain in left hip: Secondary | ICD-10-CM | POA: Diagnosis not present

## 2017-10-02 DIAGNOSIS — G8929 Other chronic pain: Secondary | ICD-10-CM | POA: Diagnosis not present

## 2017-10-02 DIAGNOSIS — R531 Weakness: Secondary | ICD-10-CM | POA: Diagnosis not present

## 2017-10-02 DIAGNOSIS — M25552 Pain in left hip: Secondary | ICD-10-CM | POA: Diagnosis not present

## 2017-10-02 DIAGNOSIS — J441 Chronic obstructive pulmonary disease with (acute) exacerbation: Secondary | ICD-10-CM | POA: Diagnosis not present

## 2017-10-09 DIAGNOSIS — M25552 Pain in left hip: Secondary | ICD-10-CM | POA: Diagnosis not present

## 2017-10-09 DIAGNOSIS — R531 Weakness: Secondary | ICD-10-CM | POA: Diagnosis not present

## 2017-10-09 DIAGNOSIS — G8929 Other chronic pain: Secondary | ICD-10-CM | POA: Diagnosis not present

## 2017-10-16 DIAGNOSIS — M25552 Pain in left hip: Secondary | ICD-10-CM | POA: Diagnosis not present

## 2017-10-16 DIAGNOSIS — R531 Weakness: Secondary | ICD-10-CM | POA: Diagnosis not present

## 2017-10-16 DIAGNOSIS — G8929 Other chronic pain: Secondary | ICD-10-CM | POA: Diagnosis not present

## 2017-10-24 DIAGNOSIS — M79605 Pain in left leg: Secondary | ICD-10-CM | POA: Diagnosis not present

## 2017-10-24 DIAGNOSIS — R29898 Other symptoms and signs involving the musculoskeletal system: Secondary | ICD-10-CM | POA: Diagnosis not present

## 2017-10-24 DIAGNOSIS — G8929 Other chronic pain: Secondary | ICD-10-CM | POA: Diagnosis not present

## 2017-10-24 DIAGNOSIS — R200 Anesthesia of skin: Secondary | ICD-10-CM | POA: Diagnosis not present

## 2017-10-24 DIAGNOSIS — M25552 Pain in left hip: Secondary | ICD-10-CM | POA: Diagnosis not present

## 2017-10-24 DIAGNOSIS — M545 Low back pain: Secondary | ICD-10-CM | POA: Diagnosis not present

## 2017-11-21 DIAGNOSIS — E1129 Type 2 diabetes mellitus with other diabetic kidney complication: Secondary | ICD-10-CM | POA: Diagnosis not present

## 2017-11-21 DIAGNOSIS — R809 Proteinuria, unspecified: Secondary | ICD-10-CM | POA: Diagnosis not present

## 2017-11-21 DIAGNOSIS — I10 Essential (primary) hypertension: Secondary | ICD-10-CM | POA: Diagnosis not present

## 2017-11-21 DIAGNOSIS — E7800 Pure hypercholesterolemia, unspecified: Secondary | ICD-10-CM | POA: Diagnosis not present

## 2017-11-28 DIAGNOSIS — E7800 Pure hypercholesterolemia, unspecified: Secondary | ICD-10-CM | POA: Diagnosis not present

## 2017-11-28 DIAGNOSIS — I10 Essential (primary) hypertension: Secondary | ICD-10-CM | POA: Diagnosis not present

## 2017-11-28 DIAGNOSIS — R809 Proteinuria, unspecified: Secondary | ICD-10-CM | POA: Diagnosis not present

## 2017-11-28 DIAGNOSIS — J410 Simple chronic bronchitis: Secondary | ICD-10-CM | POA: Diagnosis not present

## 2017-11-28 DIAGNOSIS — E1129 Type 2 diabetes mellitus with other diabetic kidney complication: Secondary | ICD-10-CM | POA: Diagnosis not present

## 2017-12-28 DIAGNOSIS — L821 Other seborrheic keratosis: Secondary | ICD-10-CM | POA: Diagnosis not present

## 2017-12-28 DIAGNOSIS — D225 Melanocytic nevi of trunk: Secondary | ICD-10-CM | POA: Diagnosis not present

## 2017-12-28 DIAGNOSIS — L578 Other skin changes due to chronic exposure to nonionizing radiation: Secondary | ICD-10-CM | POA: Diagnosis not present

## 2017-12-28 DIAGNOSIS — Z1283 Encounter for screening for malignant neoplasm of skin: Secondary | ICD-10-CM | POA: Diagnosis not present

## 2017-12-28 DIAGNOSIS — L820 Inflamed seborrheic keratosis: Secondary | ICD-10-CM | POA: Diagnosis not present

## 2017-12-28 DIAGNOSIS — L812 Freckles: Secondary | ICD-10-CM | POA: Diagnosis not present

## 2017-12-28 DIAGNOSIS — K130 Diseases of lips: Secondary | ICD-10-CM | POA: Diagnosis not present

## 2017-12-28 DIAGNOSIS — D2362 Other benign neoplasm of skin of left upper limb, including shoulder: Secondary | ICD-10-CM | POA: Diagnosis not present

## 2017-12-28 DIAGNOSIS — D1801 Hemangioma of skin and subcutaneous tissue: Secondary | ICD-10-CM | POA: Diagnosis not present

## 2018-03-23 DIAGNOSIS — R809 Proteinuria, unspecified: Secondary | ICD-10-CM | POA: Diagnosis not present

## 2018-03-23 DIAGNOSIS — E7800 Pure hypercholesterolemia, unspecified: Secondary | ICD-10-CM | POA: Diagnosis not present

## 2018-03-23 DIAGNOSIS — I10 Essential (primary) hypertension: Secondary | ICD-10-CM | POA: Diagnosis not present

## 2018-03-23 DIAGNOSIS — E1129 Type 2 diabetes mellitus with other diabetic kidney complication: Secondary | ICD-10-CM | POA: Diagnosis not present

## 2018-04-03 DIAGNOSIS — E1129 Type 2 diabetes mellitus with other diabetic kidney complication: Secondary | ICD-10-CM | POA: Diagnosis not present

## 2018-04-03 DIAGNOSIS — E7800 Pure hypercholesterolemia, unspecified: Secondary | ICD-10-CM | POA: Diagnosis not present

## 2018-04-03 DIAGNOSIS — I10 Essential (primary) hypertension: Secondary | ICD-10-CM | POA: Diagnosis not present

## 2018-04-03 DIAGNOSIS — J410 Simple chronic bronchitis: Secondary | ICD-10-CM | POA: Diagnosis not present

## 2018-04-03 DIAGNOSIS — R809 Proteinuria, unspecified: Secondary | ICD-10-CM | POA: Diagnosis not present

## 2018-06-01 DIAGNOSIS — J40 Bronchitis, not specified as acute or chronic: Secondary | ICD-10-CM | POA: Diagnosis not present

## 2018-06-01 DIAGNOSIS — J441 Chronic obstructive pulmonary disease with (acute) exacerbation: Secondary | ICD-10-CM | POA: Diagnosis not present

## 2018-06-08 DIAGNOSIS — J40 Bronchitis, not specified as acute or chronic: Secondary | ICD-10-CM | POA: Diagnosis not present

## 2018-06-08 DIAGNOSIS — R110 Nausea: Secondary | ICD-10-CM | POA: Diagnosis not present

## 2018-07-05 DIAGNOSIS — Z23 Encounter for immunization: Secondary | ICD-10-CM | POA: Diagnosis not present

## 2018-07-31 DIAGNOSIS — E1129 Type 2 diabetes mellitus with other diabetic kidney complication: Secondary | ICD-10-CM | POA: Diagnosis not present

## 2018-07-31 DIAGNOSIS — E7800 Pure hypercholesterolemia, unspecified: Secondary | ICD-10-CM | POA: Diagnosis not present

## 2018-07-31 DIAGNOSIS — R809 Proteinuria, unspecified: Secondary | ICD-10-CM | POA: Diagnosis not present

## 2018-07-31 DIAGNOSIS — I10 Essential (primary) hypertension: Secondary | ICD-10-CM | POA: Diagnosis not present

## 2018-07-31 DIAGNOSIS — J410 Simple chronic bronchitis: Secondary | ICD-10-CM | POA: Diagnosis not present

## 2018-08-07 DIAGNOSIS — I10 Essential (primary) hypertension: Secondary | ICD-10-CM | POA: Diagnosis not present

## 2018-08-07 DIAGNOSIS — J410 Simple chronic bronchitis: Secondary | ICD-10-CM | POA: Diagnosis not present

## 2018-08-07 DIAGNOSIS — R809 Proteinuria, unspecified: Secondary | ICD-10-CM | POA: Diagnosis not present

## 2018-08-07 DIAGNOSIS — E7800 Pure hypercholesterolemia, unspecified: Secondary | ICD-10-CM | POA: Diagnosis not present

## 2018-08-07 DIAGNOSIS — Z0000 Encounter for general adult medical examination without abnormal findings: Secondary | ICD-10-CM | POA: Diagnosis not present

## 2018-08-07 DIAGNOSIS — E1129 Type 2 diabetes mellitus with other diabetic kidney complication: Secondary | ICD-10-CM | POA: Diagnosis not present

## 2018-08-13 DIAGNOSIS — J019 Acute sinusitis, unspecified: Secondary | ICD-10-CM | POA: Diagnosis not present

## 2018-12-03 DIAGNOSIS — E1129 Type 2 diabetes mellitus with other diabetic kidney complication: Secondary | ICD-10-CM | POA: Diagnosis not present

## 2018-12-03 DIAGNOSIS — I10 Essential (primary) hypertension: Secondary | ICD-10-CM | POA: Diagnosis not present

## 2018-12-03 DIAGNOSIS — E7800 Pure hypercholesterolemia, unspecified: Secondary | ICD-10-CM | POA: Diagnosis not present

## 2018-12-03 DIAGNOSIS — R809 Proteinuria, unspecified: Secondary | ICD-10-CM | POA: Diagnosis not present

## 2019-06-18 DIAGNOSIS — Z7689 Persons encountering health services in other specified circumstances: Secondary | ICD-10-CM | POA: Diagnosis not present

## 2019-10-16 DIAGNOSIS — Z1211 Encounter for screening for malignant neoplasm of colon: Secondary | ICD-10-CM | POA: Diagnosis not present

## 2019-10-16 DIAGNOSIS — Z1212 Encounter for screening for malignant neoplasm of rectum: Secondary | ICD-10-CM | POA: Diagnosis not present

## 2019-12-16 DIAGNOSIS — E1129 Type 2 diabetes mellitus with other diabetic kidney complication: Secondary | ICD-10-CM | POA: Diagnosis not present

## 2019-12-16 DIAGNOSIS — I10 Essential (primary) hypertension: Secondary | ICD-10-CM | POA: Diagnosis not present

## 2019-12-16 DIAGNOSIS — J410 Simple chronic bronchitis: Secondary | ICD-10-CM | POA: Diagnosis not present

## 2019-12-16 DIAGNOSIS — E7800 Pure hypercholesterolemia, unspecified: Secondary | ICD-10-CM | POA: Diagnosis not present

## 2019-12-16 DIAGNOSIS — R809 Proteinuria, unspecified: Secondary | ICD-10-CM | POA: Diagnosis not present

## 2019-12-16 DIAGNOSIS — Z135 Encounter for screening for eye and ear disorders: Secondary | ICD-10-CM | POA: Diagnosis not present

## 2019-12-18 DIAGNOSIS — R809 Proteinuria, unspecified: Secondary | ICD-10-CM | POA: Diagnosis not present

## 2019-12-18 DIAGNOSIS — E7800 Pure hypercholesterolemia, unspecified: Secondary | ICD-10-CM | POA: Diagnosis not present

## 2019-12-18 DIAGNOSIS — E1129 Type 2 diabetes mellitus with other diabetic kidney complication: Secondary | ICD-10-CM | POA: Diagnosis not present

## 2020-01-28 DIAGNOSIS — M26622 Arthralgia of left temporomandibular joint: Secondary | ICD-10-CM | POA: Diagnosis not present

## 2020-01-28 DIAGNOSIS — H9192 Unspecified hearing loss, left ear: Secondary | ICD-10-CM | POA: Diagnosis not present

## 2020-01-31 DIAGNOSIS — J301 Allergic rhinitis due to pollen: Secondary | ICD-10-CM | POA: Diagnosis not present

## 2020-01-31 DIAGNOSIS — H903 Sensorineural hearing loss, bilateral: Secondary | ICD-10-CM | POA: Diagnosis not present

## 2020-03-30 DIAGNOSIS — H60502 Unspecified acute noninfective otitis externa, left ear: Secondary | ICD-10-CM | POA: Diagnosis not present

## 2020-04-14 DIAGNOSIS — I10 Essential (primary) hypertension: Secondary | ICD-10-CM | POA: Diagnosis not present

## 2020-04-14 DIAGNOSIS — R809 Proteinuria, unspecified: Secondary | ICD-10-CM | POA: Diagnosis not present

## 2020-04-14 DIAGNOSIS — E1129 Type 2 diabetes mellitus with other diabetic kidney complication: Secondary | ICD-10-CM | POA: Diagnosis not present

## 2020-04-14 DIAGNOSIS — E7800 Pure hypercholesterolemia, unspecified: Secondary | ICD-10-CM | POA: Diagnosis not present

## 2020-04-21 DIAGNOSIS — I10 Essential (primary) hypertension: Secondary | ICD-10-CM | POA: Diagnosis not present

## 2020-04-21 DIAGNOSIS — E7800 Pure hypercholesterolemia, unspecified: Secondary | ICD-10-CM | POA: Diagnosis not present

## 2020-04-21 DIAGNOSIS — E1129 Type 2 diabetes mellitus with other diabetic kidney complication: Secondary | ICD-10-CM | POA: Diagnosis not present

## 2020-04-21 DIAGNOSIS — Z0000 Encounter for general adult medical examination without abnormal findings: Secondary | ICD-10-CM | POA: Diagnosis not present

## 2020-04-21 DIAGNOSIS — R809 Proteinuria, unspecified: Secondary | ICD-10-CM | POA: Diagnosis not present

## 2020-04-21 DIAGNOSIS — J410 Simple chronic bronchitis: Secondary | ICD-10-CM | POA: Diagnosis not present

## 2020-05-26 DIAGNOSIS — E119 Type 2 diabetes mellitus without complications: Secondary | ICD-10-CM | POA: Diagnosis not present

## 2020-05-26 DIAGNOSIS — H25041 Posterior subcapsular polar age-related cataract, right eye: Secondary | ICD-10-CM | POA: Diagnosis not present

## 2020-05-26 DIAGNOSIS — H35431 Paving stone degeneration of retina, right eye: Secondary | ICD-10-CM | POA: Diagnosis not present

## 2020-06-03 DIAGNOSIS — E7800 Pure hypercholesterolemia, unspecified: Secondary | ICD-10-CM | POA: Diagnosis not present

## 2020-06-03 DIAGNOSIS — R809 Proteinuria, unspecified: Secondary | ICD-10-CM | POA: Diagnosis not present

## 2020-06-03 DIAGNOSIS — I10 Essential (primary) hypertension: Secondary | ICD-10-CM | POA: Diagnosis not present

## 2020-06-03 DIAGNOSIS — J410 Simple chronic bronchitis: Secondary | ICD-10-CM | POA: Diagnosis not present

## 2020-06-03 DIAGNOSIS — F329 Major depressive disorder, single episode, unspecified: Secondary | ICD-10-CM | POA: Diagnosis not present

## 2020-06-03 DIAGNOSIS — Z23 Encounter for immunization: Secondary | ICD-10-CM | POA: Diagnosis not present

## 2020-06-03 DIAGNOSIS — E1129 Type 2 diabetes mellitus with other diabetic kidney complication: Secondary | ICD-10-CM | POA: Diagnosis not present

## 2020-06-09 DIAGNOSIS — H25043 Posterior subcapsular polar age-related cataract, bilateral: Secondary | ICD-10-CM | POA: Diagnosis not present

## 2020-06-09 DIAGNOSIS — H18413 Arcus senilis, bilateral: Secondary | ICD-10-CM | POA: Diagnosis not present

## 2020-06-09 DIAGNOSIS — H2511 Age-related nuclear cataract, right eye: Secondary | ICD-10-CM | POA: Diagnosis not present

## 2020-06-09 DIAGNOSIS — H2513 Age-related nuclear cataract, bilateral: Secondary | ICD-10-CM | POA: Diagnosis not present

## 2020-06-09 DIAGNOSIS — H25013 Cortical age-related cataract, bilateral: Secondary | ICD-10-CM | POA: Diagnosis not present

## 2020-06-29 DIAGNOSIS — H25041 Posterior subcapsular polar age-related cataract, right eye: Secondary | ICD-10-CM | POA: Diagnosis not present

## 2020-06-29 DIAGNOSIS — H2511 Age-related nuclear cataract, right eye: Secondary | ICD-10-CM | POA: Diagnosis not present

## 2020-08-05 ENCOUNTER — Other Ambulatory Visit: Payer: Self-pay

## 2020-08-05 ENCOUNTER — Telehealth (INDEPENDENT_AMBULATORY_CARE_PROVIDER_SITE_OTHER): Payer: PPO | Admitting: Psychiatry

## 2020-08-05 ENCOUNTER — Encounter: Payer: Self-pay | Admitting: Psychiatry

## 2020-08-05 DIAGNOSIS — F419 Anxiety disorder, unspecified: Secondary | ICD-10-CM

## 2020-08-05 DIAGNOSIS — F320 Major depressive disorder, single episode, mild: Secondary | ICD-10-CM | POA: Diagnosis not present

## 2020-08-05 DIAGNOSIS — F411 Generalized anxiety disorder: Secondary | ICD-10-CM | POA: Insufficient documentation
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
## 2020-08-17 ENCOUNTER — Ambulatory Visit: Payer: PPO | Admitting: Licensed Clinical Social Worker

## 2020-08-20 DIAGNOSIS — E1129 Type 2 diabetes mellitus with other diabetic kidney complication: Secondary | ICD-10-CM | POA: Diagnosis not present

## 2020-08-20 DIAGNOSIS — E7800 Pure hypercholesterolemia, unspecified: Secondary | ICD-10-CM | POA: Diagnosis not present

## 2020-08-20 DIAGNOSIS — I10 Essential (primary) hypertension: Secondary | ICD-10-CM | POA: Diagnosis not present

## 2020-08-20 DIAGNOSIS — R809 Proteinuria, unspecified: Secondary | ICD-10-CM | POA: Diagnosis not present

## 2020-08-24 DIAGNOSIS — E7800 Pure hypercholesterolemia, unspecified: Secondary | ICD-10-CM | POA: Diagnosis not present

## 2020-08-24 DIAGNOSIS — J410 Simple chronic bronchitis: Secondary | ICD-10-CM | POA: Diagnosis not present

## 2020-08-24 DIAGNOSIS — I10 Essential (primary) hypertension: Secondary | ICD-10-CM | POA: Diagnosis not present

## 2020-08-24 DIAGNOSIS — E1129 Type 2 diabetes mellitus with other diabetic kidney complication: Secondary | ICD-10-CM | POA: Diagnosis not present

## 2020-08-24 DIAGNOSIS — R809 Proteinuria, unspecified: Secondary | ICD-10-CM | POA: Diagnosis not present

## 2020-08-27 ENCOUNTER — Encounter: Payer: Self-pay | Admitting: Licensed Clinical Social Worker

## 2020-08-27 ENCOUNTER — Other Ambulatory Visit: Payer: Self-pay

## 2020-08-27 ENCOUNTER — Ambulatory Visit (INDEPENDENT_AMBULATORY_CARE_PROVIDER_SITE_OTHER): Payer: PPO | Admitting: Licensed Clinical Social Worker

## 2020-08-27 DIAGNOSIS — F320 Major depressive disorder, single episode, mild: Secondary | ICD-10-CM

## 2020-08-27 DIAGNOSIS — F419 Anxiety disorder, unspecified: Secondary | ICD-10-CM
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
## 2020-09-15 DIAGNOSIS — J40 Bronchitis, not specified as acute or chronic: Secondary | ICD-10-CM | POA: Diagnosis not present

## 2020-09-17 ENCOUNTER — Other Ambulatory Visit: Payer: Self-pay

## 2020-09-17 ENCOUNTER — Encounter: Payer: Self-pay | Admitting: Licensed Clinical Social Worker

## 2020-09-17 ENCOUNTER — Ambulatory Visit (INDEPENDENT_AMBULATORY_CARE_PROVIDER_SITE_OTHER): Payer: PPO | Admitting: Licensed Clinical Social Worker

## 2020-09-17 DIAGNOSIS — F320 Major depressive disorder, single episode, mild: Secondary | ICD-10-CM | POA: Diagnosis not present

## 2020-09-17 DIAGNOSIS — F419 Anxiety disorder, unspecified: Secondary | ICD-10-CM

## 2020-09-17 NOTE — Progress Notes (Signed)
Virtual Visit via Telephone Note  I connected with Marcus Gentry. on 09/17/20 at 11:00 AM EST by telephone and verified that I am speaking with the correct person using two identifiers.  Participating Parties Patient Provider  Location: Patient: Home Provider: Home Office   I discussed the limitations, risks, security and privacy concerns of performing an evaluation and management service by telephone and the availability of in person appointments. I also discussed with the patient that there may be a patient responsible charge related to this service. The patient expressed understanding and agreed to proceed.  THERAPY PROGRESS NOTE  Session Time: 77 Minutes  Participation Level: Active  Behavioral Response: AlertDepressed  Type of Therapy: Individual Therapy  Treatment Goals addressed: Anxiety and Coping  Interventions: CBT  Summary: [name]. is a 64 y.o. male who presents with depression and anxiety sxs. Pt identified current stressors and attempts to cope. Pt reported relationship with long-term girlfriend is often conflictual, however denied any escalation to physical violence. Pt reported he would like to work on his reactions to girlfriend's moods and acknowledged feeling "on edge" and "jumps" to conclusions when angry/defensive. Pt reflected on childhood and history of previous relationships. Pt concluded "I have a lot of regrets and maybe I am the problem".  Suicidal/Homicidal: No  Therapist Response: Therapist met with patient for follow up after completing CCA. Therapist and patient reviewed treatment plan and goals. Pt in agreement. Therapist and patient explored stressors and attempts to cope. Therapist provided psychoeducation around journaling and communication skills to help patient monitor and begin to address behavioral responses he would like to change. Pt was receptive.  Plan: Return again in 2 weeks.  Diagnosis: Axis I: Anxiety Disorder NOS and Major  Depression, single episode    Axis II: N/A  [name], LCSW, LCAS 09/17/2020

## 2020-10-01 ENCOUNTER — Encounter: Payer: Self-pay | Admitting: Licensed Clinical Social Worker

## 2020-10-01 ENCOUNTER — Ambulatory Visit (INDEPENDENT_AMBULATORY_CARE_PROVIDER_SITE_OTHER): Payer: PPO | Admitting: Licensed Clinical Social Worker

## 2020-10-01 ENCOUNTER — Other Ambulatory Visit: Payer: Self-pay

## 2020-10-01 DIAGNOSIS — F320 Major depressive disorder, single episode, mild: Secondary | ICD-10-CM

## 2020-10-01 DIAGNOSIS — F419 Anxiety disorder, unspecified: Secondary | ICD-10-CM | POA: Diagnosis not present

## 2020-10-01 NOTE — Progress Notes (Signed)
Virtual Visit via Telephone Note  I connected with Marcus Gentry. on 10/01/20 at  1:00 PM EST by telephone and verified that I am speaking with the correct person using two identifiers.  Participating Parties Patient Provider  Location: Patient: Vehicle Provider: Home Office   I discussed the limitations, risks, security and privacy concerns of performing an evaluation and management service by telephone and the availability of in person appointments. I also discussed with the patient that there may be a patient responsible charge related to this service. The patient expressed understanding and agreed to proceed.   THERAPY PROGRESS NOTE  Session Time: 56 Minutes  Participation Level: Active  Behavioral Response: AlertDepressed  Type of Therapy: Individual Therapy  Treatment Goals addressed: Anger, Anxiety and Coping  Interventions: CBT  Summary: [name]. is a 65 y.o. male who presents with depression and anxiety sxs. Pt reported things have been going better since last session. Pt reported "I try to use humor to deal with things" and have booked a trip to [place] next week with long-term girlfriend. Pt reported having family around was a good distraction and likes to take frequent long walks. Pt reported he continues to struggle with anxious thoughts, however, and "I have a big problem sleeping". Pt reported that he has wanted to try meditation. Pt reported he downloaded the Calm app, however has not used it. Pt reported he wants to work on improving patience.   Suicidal/Homicidal: No   Therapist Response: Therapist met with patient for follow up. Therapist and patient explored use of coping skills and discussed benefits of meditation. Therapist provided psychoeducation on cognitive distortions. Pt was receptive. Therapist encouraged patient to practice 5 minutes of meditation at least once between now and next session and to read over materials from today's session.    Plan: Return again in 3 weeks.  Diagnosis: Axis I: Anxiety Disorder NOS and Major Depression, single episode    Axis II: N/A  [name], LCSW, LCAS 10/01/2020

## 2020-10-15 ENCOUNTER — Encounter: Payer: Self-pay | Admitting: Emergency Medicine

## 2020-10-15 ENCOUNTER — Emergency Department
Admission: EM | Admit: 2020-10-15 | Discharge: 2020-10-15 | Disposition: A | Discharge status: Home / Self Care | Payer: PPO | Attending: Emergency Medicine | Admitting: Emergency Medicine

## 2020-10-15 ENCOUNTER — Other Ambulatory Visit: Payer: Self-pay

## 2020-10-15 ENCOUNTER — Emergency Department: Payer: PPO

## 2020-10-15 DIAGNOSIS — E119 Type 2 diabetes mellitus without complications: Secondary | ICD-10-CM | POA: Insufficient documentation

## 2020-10-15 DIAGNOSIS — Y92520 Airport as the place of occurrence of the external cause: Secondary | ICD-10-CM | POA: Diagnosis not present

## 2020-10-15 DIAGNOSIS — Z87891 Personal history of nicotine dependence: Secondary | ICD-10-CM | POA: Diagnosis not present

## 2020-10-15 DIAGNOSIS — R34 Anuria and oliguria: Secondary | ICD-10-CM | POA: Diagnosis not present

## 2020-10-15 DIAGNOSIS — Z7984 Long term (current) use of oral hypoglycemic drugs: Secondary | ICD-10-CM | POA: Insufficient documentation

## 2020-10-15 DIAGNOSIS — I10 Essential (primary) hypertension: Secondary | ICD-10-CM | POA: Insufficient documentation

## 2020-10-15 DIAGNOSIS — W009XXA Unspecified fall due to ice and snow, initial encounter: Secondary | ICD-10-CM | POA: Insufficient documentation

## 2020-10-15 DIAGNOSIS — M545 Low back pain, unspecified: Secondary | ICD-10-CM | POA: Diagnosis not present

## 2020-10-15 DIAGNOSIS — Z79899 Other long term (current) drug therapy: Secondary | ICD-10-CM | POA: Diagnosis not present

## 2020-10-15 DIAGNOSIS — M533 Sacrococcygeal disorders, not elsewhere classified: Secondary | ICD-10-CM | POA: Diagnosis not present

## 2020-10-15 MED ORDER — NAPROXEN SODIUM 550 MG PO TABS: 550.0000 mg | ORAL_TABLET | Freq: Two times a day (BID) | ORAL | 0 refills | Status: AC

## 2020-10-15 MED ORDER — LIDOCAINE 5 % EX PTCH: 1.0000 | MEDICATED_PATCH | Freq: Two times a day (BID) | CUTANEOUS | 0 refills | Status: AC

## 2020-10-15 MED ORDER — ORPHENADRINE CITRATE 30 MG/ML IJ SOLN
60.0000 mg | Freq: Two times a day (BID) | INTRAMUSCULAR | Status: DC
  Administered 2020-10-15 (×2): 60 mg via INTRAMUSCULAR
  Filled 2020-10-15 (×2): qty 2

## 2020-10-15 MED ORDER — LIDOCAINE 5 % EX PTCH
1.0000 | MEDICATED_PATCH | CUTANEOUS | Status: DC
  Administered 2020-10-15: 1 via TRANSDERMAL
  Filled 2020-10-15: qty 1

## 2020-10-15 MED ORDER — TRAMADOL HCL 50 MG PO TABS: 50.0000 mg | ORAL_TABLET | Freq: Two times a day (BID) | ORAL | 0 refills | Status: AC | PRN

## 2020-10-15 MED ORDER — KETOROLAC TROMETHAMINE 30 MG/ML IJ SOLN
30.0000 mg | Freq: Once | INTRAMUSCULAR | Status: AC
  Administered 2020-10-15: 30 mg via INTRAMUSCULAR
  Filled 2020-10-15: qty 1

## 2020-10-15 MED ORDER — ORPHENADRINE CITRATE ER 100 MG PO TB12: 100.0000 mg | ORAL_TABLET | Freq: Two times a day (BID) | ORAL | 0 refills | Status: AC

## 2020-10-15 NOTE — ED Triage Notes (Signed)
Slipped on ice last week and fell.  Arrives today c/o back pain.    AAOx3.  Skin warm and dry. NAD.  Ambulates with easy and steady gait.  MAE equally and strong.

## 2020-10-15 NOTE — ED Notes (Signed)
Discharge instructions reviewed with pt and spouse.

## 2020-10-15 NOTE — Discharge Instructions (Addendum)
No acute findings on x-ray of the lumbar spine.  Follow discharge care instruction take medication as directed. 

## 2020-10-15 NOTE — ED Provider Notes (Signed)
[facility] Emergency Department Provider Note   ____________________________________________   Event Date/Time   First MD Initiated Contact with Patient 10/15/20 1001     (approximate)  I have reviewed the triage vital signs and the nursing notes.   HISTORY  Chief Complaint Back Pain    HPI Marcus Gentry. is a 65 y.o. male patient complain of mid and low back pain secondary to a slip and fall 1 week ago. Patient state he was rushing to get to the airport and slipped and fell on the ice. Patient state pain increased on his trip to [place]. Patient state mild relief with over-the-counter anti-inflammatory medications. Patient denies radicular component to his back pain. Patient denies bowel dysfunction. Patient states he has noticed a decrease in his urine output. Patient stated pain is worse with a.m. awakening and eases with movement. Patient  continue to take an over-the-counter anti-inflammatory medications.         Past Medical History:  Diagnosis Date  . Anxiety   . Arthritis   . Diabetes mellitus, type II ([place])   . High cholesterol   . Kidney stones     Patient Active Problem List   Diagnosis Date Noted  . Current mild episode of major depressive disorder without prior episode ([place]) 08/05/2020  . GAD (generalized anxiety disorder) 08/05/2020  . Healthcare maintenance 07/20/2017  . Simple chronic bronchitis ([place]) 05/10/2017  . HTN, goal below 140/90 03/14/2017  . Anxiety and depression 04/27/2016  . Controlled type 2 diabetes mellitus with microalbuminuria, without long-term current use of insulin ([place]) 02/24/2016  . Pure hypercholesterolemia 02/24/2016    Past Surgical History:  Procedure Laterality Date  . CHOLECYSTECTOMY    . LYMPH NODE BIOPSY      Prior to Admission medications   Medication Sig Start Date End Date Taking? Authorizing Provider  lidocaine (LIDODERM) 5 % Place 1 patch onto the skin every 12 (twelve) hours. Remove  & Discard patch within 12 hours or as directed by MD 10/15/20 10/15/21 Yes Sable Feil, PA-C  naproxen sodium (ANAPROX) 550 MG tablet Take 1 tablet (550 mg total) by mouth 2 (two) times daily with a meal. 10/15/20  Yes Sable Feil, PA-C  orphenadrine (NORFLEX) 100 MG tablet Take 1 tablet (100 mg total) by mouth 2 (two) times daily. 10/15/20  Yes Sable Feil, PA-C  traMADol (ULTRAM) 50 MG tablet Take 1 tablet (50 mg total) by mouth every 12 (twelve) hours as needed for up to 5 days. 10/15/20 10/20/20 Yes Sable Feil, PA-C  albuterol (PROVENTIL HFA;VENTOLIN HFA) 108 (90 Base) MCG/ACT inhaler Inhale into the lungs. 02/24/16 02/23/17  [provider]  atorvastatin (LIPITOR) 80 MG tablet Take by mouth. 02/25/16 02/24/17  [provider]  atorvastatin (LIPITOR) 80 MG tablet  02/29/16   [provider]  glimepiride (AMARYL) 4 MG tablet Take by mouth. 04/21/20 04/21/21  [provider]  JARDIANCE 10 MG TABS tablet  02/29/16   [provider]  linagliptin (TRADJENTA) 5 MG TABS tablet Take by mouth. 02/25/16   [provider]  losartan (COZAAR) 100 MG tablet Take by mouth. 12/16/19   [provider]  metFORMIN (GLUCOPHAGE-XR) 500 MG 24 hr tablet Take by mouth. 04/21/20   [provider]    Allergies Levofloxacin and Metformin  Family History  Problem Relation Age of Onset  . Mental illness Neg Hx     Social History Social History   Tobacco Use  . Smoking  status: Former Smoker    Quit date: 08/05/2006    Years since quitting: 14.2  . Smokeless tobacco: Never Used  Substance Use Topics  . Alcohol use: Yes    Comment: SOCIAL  . Drug use: Yes    Types: Marijuana    Comment: Last use 35 years ago    Review of Systems  Constitutional: No fever/chills Eyes: No visual changes. ENT: No sore throat. Cardiovascular: Denies chest pain. Respiratory: Denies shortness of breath. Gastrointestinal: No abdominal pain.  No nausea, no  vomiting.  No diarrhea.  No constipation. Genitourinary: Negative for dysuria. Decreased urination. Musculoskeletal: Positive for back pain. Skin: Negative for rash. Neurological: Negative for headaches, focal weakness or numbness. Endocrine:  Diabetes and hypertension Hematological/Lymphatic:  Allergic/Immunilogical: Levofloxacin and Metformin ____________________________________________   PHYSICAL EXAM:  VITAL SIGNS: ED Triage Vitals  Enc Vitals Group     BP 10/15/20 0917 139/72     Pulse Rate 10/15/20 0917 64     Resp 10/15/20 0917 16     Temp 10/15/20 0917 (!) 97.5 F (36.4 C)     Temp Source 10/15/20 0917 Oral     SpO2 10/15/20 0917 96 %     Weight 10/15/20 0915 180 lb (81.6 kg)     Height 10/15/20 0915 6' (1.829 m)     Head Circumference --      Peak Flow --      Pain Score 10/15/20 0915 7     Pain Loc --      Pain Edu? --      Excl. in [place]? --    Constitutional: Alert and oriented. Well appearing and in no acute distress. Eyes: Conjunctivae are normal. PERRL. EOMI. Head: Atraumatic. Nose: No congestion/rhinnorhea. Mouth/Throat: Mucous membranes are moist.  Oropharynx non-erythematous. Neck: No cervical spine tenderness to palpation. Cardiovascular: Normal rate, regular rhythm. Grossly normal heart sounds.  Good peripheral circulation. Respiratory: Normal respiratory effort.  No retractions. Lungs CTAB. Gastrointestinal: Soft and nontender. No distention. No abdominal bruits. No CVA tenderness. Genitourinary: Deferred Musculoskeletal: No obvious cervical, thoracic, or lumbar spine deformity. Patient has moderate guarding palpation the lumbar spine. Patient has decreased range of motion with lateral movements. Patient sits and stands with reliance on upper extremities. No lower extremity tenderness nor edema.  No joint effusions. Neurologic:  Normal speech and language. No gross focal neurologic deficits are appreciated. No gait instability. Skin:  Skin is warm, dry  and intact. No rash noted. Psychiatric: Mood and affect are normal. Speech and behavior are normal.  ____________________________________________   LABS (all labs ordered are listed, but only abnormal results are displayed)  Labs Reviewed - No data to display ____________________________________________  EKG   ____________________________________________  RADIOLOGY I, Sable Feil, personally viewed and evaluated these images (plain radiographs) as part of my medical decision making, as well as reviewing the written report by the radiologist.  ED MD interpretation: No acute findings x-ray of the lumbar spine.  Official radiology report(s): No results found.  ____________________________________________   PROCEDURES  Procedure(s) performed (including Critical Care):  Procedures   ____________________________________________   INITIAL IMPRESSION / ASSESSMENT AND PLAN / ED COURSE  As part of my medical decision making, I reviewed the following data within the [place]         Patient presents with low back pain status post 1 week secondary to fall.  Patient the pain is worse in a week and gets better none middle of the day.  No radicular component  to pain.  Discussed no acute findings on x-ray of the lumbar spine.  Patient complaint physical exam consistent to acute back pain secondary to fall.  Patient given discharge care instruction advised take medication as directed.  Patient advised to follow-up with PCP if no improvement in 1 week.  Return to ED if condition worsens.     ____________________________________________   FINAL CLINICAL IMPRESSION(S) / ED DIAGNOSES  Final diagnoses:  Acute midline low back pain without sciatica     ED Discharge Orders         Ordered    orphenadrine (NORFLEX) 100 MG tablet  2 times daily        10/15/20 1236    naproxen sodium (ANAPROX) 550 MG tablet  2 times daily with meals        10/15/20 1236     lidocaine (LIDODERM) 5 %  Every 12 hours        10/15/20 1236    traMADol (ULTRAM) 50 MG tablet  Every 12 hours PRN        10/15/20 1237          *Please note:  Marcus Gentry. was evaluated in Emergency Department on 10/15/2020 for the symptoms described in the history of present illness. He was evaluated in the context of the global COVID-19 pandemic, which necessitated consideration that the patient might be at risk for infection with the SARS-CoV-2 virus that causes COVID-19. Institutional protocols and algorithms that pertain to the evaluation of patients at risk for COVID-19 are in a state of rapid change based on information released by regulatory bodies including the CDC and federal and state organizations. These policies and algorithms were followed during the patient's care in the ED.  Some ED evaluations and interventions may be delayed as a result of limited staffing during and the pandemic.*   Note:  This document was prepared using Dragon voice recognition software and may include unintentional dictation errors.    Sable Feil, PA-C 10/15/20 1242    Vanessa [place], MD 10/15/20 [phone]

## 2020-10-16 DIAGNOSIS — R809 Proteinuria, unspecified: Secondary | ICD-10-CM | POA: Diagnosis not present

## 2020-10-16 DIAGNOSIS — E7800 Pure hypercholesterolemia, unspecified: Secondary | ICD-10-CM | POA: Diagnosis not present

## 2020-10-16 DIAGNOSIS — E1129 Type 2 diabetes mellitus with other diabetic kidney complication: Secondary | ICD-10-CM | POA: Diagnosis not present

## 2020-10-16 DIAGNOSIS — M546 Pain in thoracic spine: Secondary | ICD-10-CM | POA: Diagnosis not present

## 2020-10-16 DIAGNOSIS — J410 Simple chronic bronchitis: Secondary | ICD-10-CM | POA: Diagnosis not present

## 2020-10-16 DIAGNOSIS — I10 Essential (primary) hypertension: Secondary | ICD-10-CM | POA: Diagnosis not present

## 2020-10-22 ENCOUNTER — Ambulatory Visit: Payer: PPO | Admitting: Licensed Clinical Social Worker

## 2020-10-26 DIAGNOSIS — H11432 Conjunctival hyperemia, left eye: Secondary | ICD-10-CM | POA: Diagnosis not present

## 2020-11-05 ENCOUNTER — Other Ambulatory Visit: Payer: Self-pay

## 2020-11-05 ENCOUNTER — Encounter: Payer: Self-pay | Admitting: Licensed Clinical Social Worker

## 2020-11-05 ENCOUNTER — Ambulatory Visit: Payer: PPO | Admitting: Licensed Clinical Social Worker

## 2020-11-05 ENCOUNTER — Ambulatory Visit (INDEPENDENT_AMBULATORY_CARE_PROVIDER_SITE_OTHER): Payer: PPO | Admitting: Licensed Clinical Social Worker

## 2020-11-05 ENCOUNTER — Ambulatory Visit: Payer: Self-pay | Admitting: Licensed Clinical Social Worker

## 2020-11-05 DIAGNOSIS — F411 Generalized anxiety disorder: Secondary | ICD-10-CM | POA: Diagnosis not present

## 2020-11-05 DIAGNOSIS — F320 Major depressive disorder, single episode, mild: Secondary | ICD-10-CM | POA: Diagnosis not present

## 2020-11-05 NOTE — Progress Notes (Signed)
Virtual Visit via Telephone Note  I connected with Marcus Gentry. on 11/05/20 at  3:00 PM EST by telephone and verified that I am speaking with the correct person using two identifiers.  Participating Parties Patient Provider  Location: Patient: Home Provider: Home Office   I discussed the limitations, risks, security and privacy concerns of performing an evaluation and management service by telephone and the availability of in person appointments. I also discussed with the patient that there may be a patient responsible charge related to this service. The patient expressed understanding and agreed to proceed.  THERAPY PROGRESS NOTE  Session Time: 69 Minutes  Participation Level: Active  Behavioral Response: AlertEuthymic  Type of Therapy: Individual Therapy  Treatment Goals addressed: Anxiety and Coping  Interventions: CBT  Summary: Marcus Maul. is a 65 y.o. male who presents with depression and anxiety sxs. Pt reported "life is going very well" since last session. Pt and long-term girlfriend recently went on vacation in [place]. Pt reported an increase in communication and intimacy. Pt reported experiencing "life changing events," including decision to stay in the relationship and purchasing a beach house. Pt reported overall reduction in anxiety and depression sxs. Pt reported despite these positive changes, struggling with "impatience" and partner has commented "I look for stuff to worry about" and ruminate "on what could go wrong". Pt described tendency to be "neat and organized" and "get overwhelmed quickly". Pt also reported continued problems with getting to and staying asleep. Pt identified coping skills he is using. Pt reported he did experience a fall prior to boarding for his trip and was evaluated by physician once he returned. Pt reported experiencing some arthritic pain and taking medication for the past 2 weeks.  Suicidal/Homicidal: No  Therapist Response:  Therapist met with patient for follow up. Therapist and patient reviewed PHQ2-9, C-SRSS, nutrition and pain assessments. Therapist and patient reviewed progress towards treatment plan goals and continued barriers. Therapist provided psychoeducation around sleep hygiene and led patient in self-soothing with the 5 senses. Pt was receptive.  Plan: Return again in 1 month.  Diagnosis: Axis I: Generalized Anxiety Disorder and Major Depression, single episode    Axis II: N/A  [name], LCSW, LCAS 11/05/2020

## 2020-12-07 ENCOUNTER — Encounter: Payer: Self-pay | Admitting: Licensed Clinical Social Worker

## 2020-12-07 ENCOUNTER — Ambulatory Visit (INDEPENDENT_AMBULATORY_CARE_PROVIDER_SITE_OTHER): Payer: PPO | Admitting: Licensed Clinical Social Worker

## 2020-12-07 ENCOUNTER — Other Ambulatory Visit: Payer: Self-pay

## 2020-12-07 DIAGNOSIS — F411 Generalized anxiety disorder: Secondary | ICD-10-CM

## 2020-12-07 DIAGNOSIS — F320 Major depressive disorder, single episode, mild: Secondary | ICD-10-CM

## 2020-12-07 NOTE — Progress Notes (Signed)
Virtual Visit via Telephone Note  I connected with Marcus Gentry. on 12/07/20 at 11:00 AM EDT by telephone and verified that I am speaking with the correct person using two identifiers.  Participating Parties Patient Provider  Location: Patient: Home Provider: Home Office   I discussed the limitations, risks, security and privacy concerns of performing an evaluation and management service by telephone and the availability of in person appointments. I also discussed with the patient that there may be a patient responsible charge related to this service. The patient expressed understanding and agreed to proceed.  THERAPY PROGRESS NOTE  Session Time: 93 Minutes  Participation Level: Active  Behavioral Response: AlertAnxious  Type of Therapy: Individual Therapy  Treatment Goals addressed: Anxiety and Coping  Interventions: CBT  Summary: Marcus Hail. is a 65 y.o. male who presents with minimal depression and some anxiety sxs. Pt reported he had to reschedule his appointment a few times due to "doing the legwork with buying the new house. I had a couple of rough spots. Overall things are well". Pt reported sleep has improved since taking 5 mg of Melatonin at night. Pt reported he and girlfriend have been on diet since February and experienced weight loss, increased energy and better eating habits. Pt reported he is looking forward to next follow up doctor's appointment to track A1C levels. Pt reported "rough spots" as arguments with girlfriend that leads to feeling "hurt". Pt reported this is an ongoing issue, however, is less frequent and believes moving into new house together [place] the beach will be "a fresh start". Pt reported that "I am anal" when it comes to disorganization and getting this dirty. Pt reported feeling anxious just thinking about times where his girlfriend's kids bring their pets when visiting and acknowledged setting ground rules is important for any future visits.    Suicidal/Homicidal: No  Therapist Response: Therapist met with patient for follow up. Therapist and patient reviewed improvement in sxs w/ taking better care of health hand resolving sleep issues. Therapist and patient brainstormed ways to address anxiety-provoking events. Therapist and patient weighed pros and cons to addressing/choosing not to address communication issues with girlfriend. Therapist provided psychoeducation around coping thoughts. Therapist informed patient regarding initial phase of terminating services with this therapist due to leaving the practice in the next 5 weeks. Pt denied any concerns around this and had no questions at this time. [place] and will seek establishment of care closer to new residence.  Plan: Return again in 3 weeks.  Diagnosis: Axis I: Generalized Anxiety Disorder and Major Depression, single episode    Axis II: N/A  [name], LCSW, LCAS 12/07/2020

## 2020-12-16 DIAGNOSIS — R809 Proteinuria, unspecified: Secondary | ICD-10-CM | POA: Diagnosis not present

## 2020-12-16 DIAGNOSIS — E1129 Type 2 diabetes mellitus with other diabetic kidney complication: Secondary | ICD-10-CM | POA: Diagnosis not present

## 2020-12-16 DIAGNOSIS — E7800 Pure hypercholesterolemia, unspecified: Secondary | ICD-10-CM | POA: Diagnosis not present

## 2020-12-16 DIAGNOSIS — I10 Essential (primary) hypertension: Secondary | ICD-10-CM | POA: Diagnosis not present

## 2020-12-17 ENCOUNTER — Ambulatory Visit: Payer: PPO | Admitting: Licensed Clinical Social Worker

## 2020-12-23 DIAGNOSIS — J410 Simple chronic bronchitis: Secondary | ICD-10-CM | POA: Diagnosis not present

## 2020-12-23 DIAGNOSIS — I10 Essential (primary) hypertension: Secondary | ICD-10-CM | POA: Diagnosis not present

## 2020-12-23 DIAGNOSIS — E7800 Pure hypercholesterolemia, unspecified: Secondary | ICD-10-CM | POA: Diagnosis not present

## 2020-12-23 DIAGNOSIS — E1129 Type 2 diabetes mellitus with other diabetic kidney complication: Secondary | ICD-10-CM | POA: Diagnosis not present

## 2020-12-23 DIAGNOSIS — R809 Proteinuria, unspecified: Secondary | ICD-10-CM | POA: Diagnosis not present

## 2020-12-31 ENCOUNTER — Encounter: Payer: Self-pay | Admitting: Licensed Clinical Social Worker

## 2020-12-31 ENCOUNTER — Ambulatory Visit (INDEPENDENT_AMBULATORY_CARE_PROVIDER_SITE_OTHER): Payer: PPO | Admitting: Licensed Clinical Social Worker

## 2020-12-31 ENCOUNTER — Other Ambulatory Visit: Payer: Self-pay

## 2020-12-31 DIAGNOSIS — F411 Generalized anxiety disorder: Secondary | ICD-10-CM | POA: Diagnosis not present

## 2020-12-31 DIAGNOSIS — F320 Major depressive disorder, single episode, mild: Secondary | ICD-10-CM | POA: Diagnosis not present

## 2020-12-31 NOTE — Progress Notes (Signed)
Virtual Visit via Telephone Note  I connected with Marcus Gentry. on 12/31/20 at 11:00 AM EDT by telephone and verified that I am speaking with the correct person using two identifiers.  Participating Parties Patient Provider  Location: Patient: Home Provider: Home Office   I discussed the limitations, risks, security and privacy concerns of performing an evaluation and management service by telephone and the availability of in person appointments. I also discussed with the patient that there may be a patient responsible charge related to this service. The patient expressed understanding and agreed to proceed.  THERAPY PROGRESS NOTE  Session Time: 10 Minutes  Participation Level: Active  Behavioral Response: AlertEuthymic  Type of Therapy: Individual Therapy  Treatment Goals addressed: Coping  Interventions: Supportive  Summary: Marcus Gentry. is a 65 y.o. male who presents with minimal depression and some anxiety sxs. Pt apologized upon answering the phone s/ "I completely forgot we had a session today" and was in the middle of packing for his move. Pt reported he continues to experience restful sleep even without the melatonin from being tired with the physical activity required to prepare for the move. Pt reported "other than being busy, things are going well. I feel happy". Pt continues to eat healthy and has been losing weight steadily. Pt reported no concerns at this time.  Suicidal/Homicidal: No  Therapist Response: Therapist checked in with patient for last session. Therapist and patient reviewed sxs and discussed ways patient is taking care of himself.   Plan: Pt will be relocating and establishing care in another county.  Diagnosis: Axis I: Generalized Anxiety Disorder and Major Depression, single episode    Axis II: N/A  [name], LCSW, LCAS 12/31/2020

## 2022-04-29 IMAGING — CR DG SACRUM/COCCYX 2+V
1 series · 3 of 3 positions shown · non-contrast
Comparison: None

CLINICAL DATA: Pain following fall

EXAM:
SACRUM AND COCCYX - 2+ VIEW

[Series 1: dg sacrum/coccyx · 0.14mm/px · 3 of 3 slices shown]
[im 1/3]
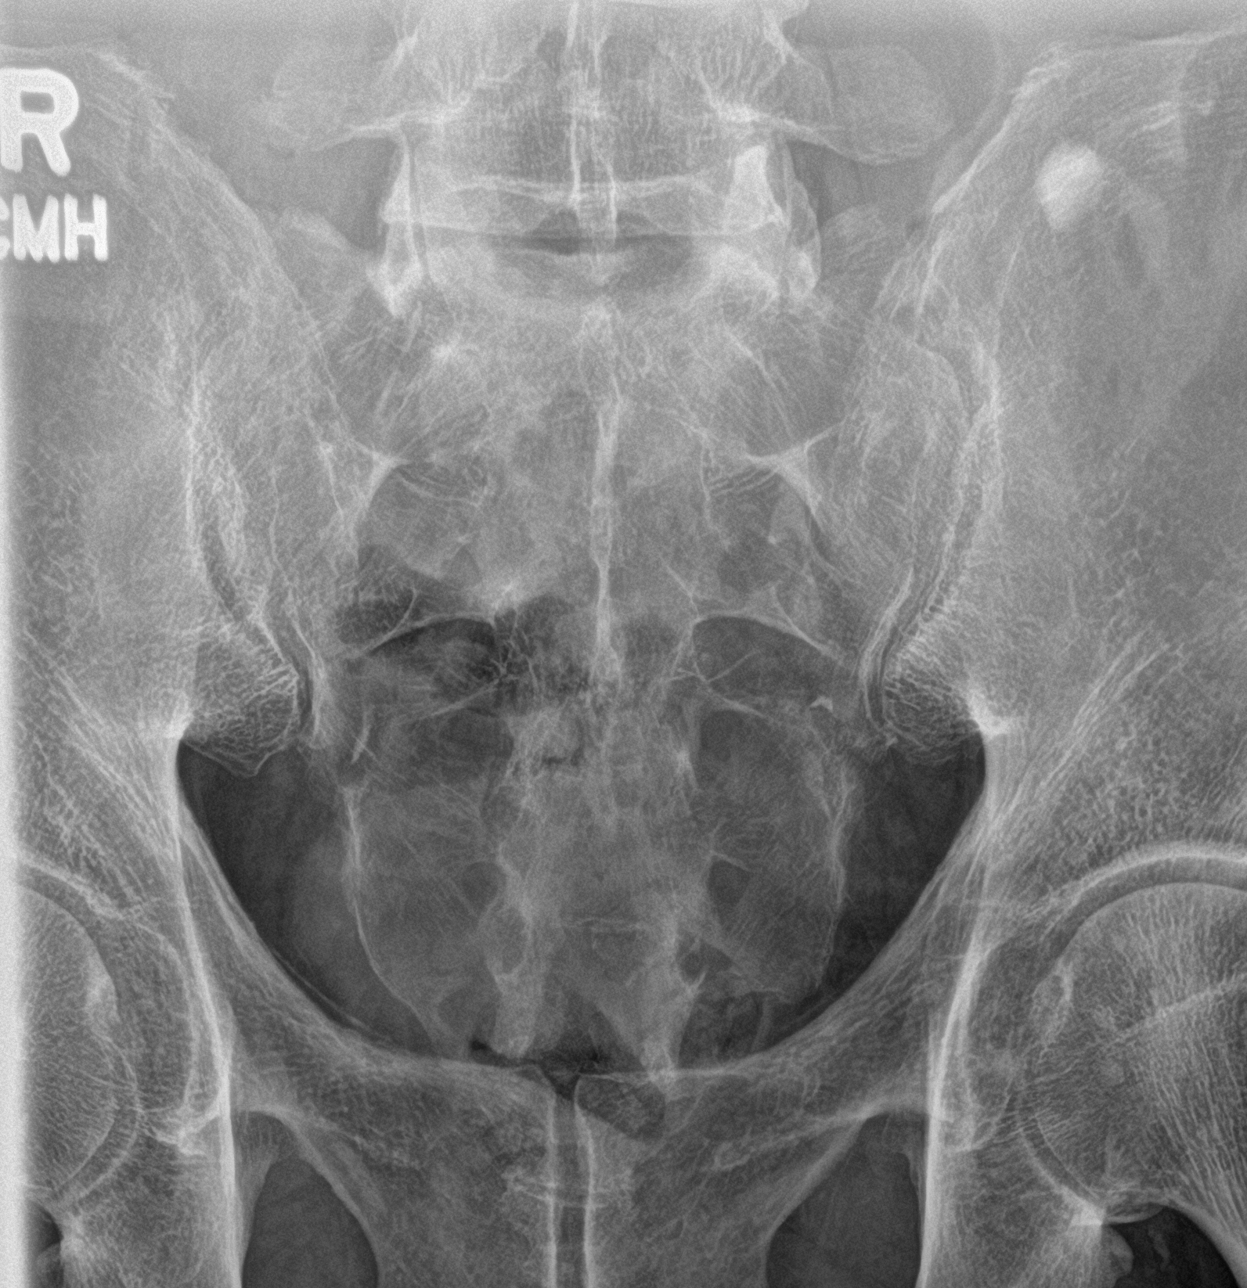
[im 2/3]
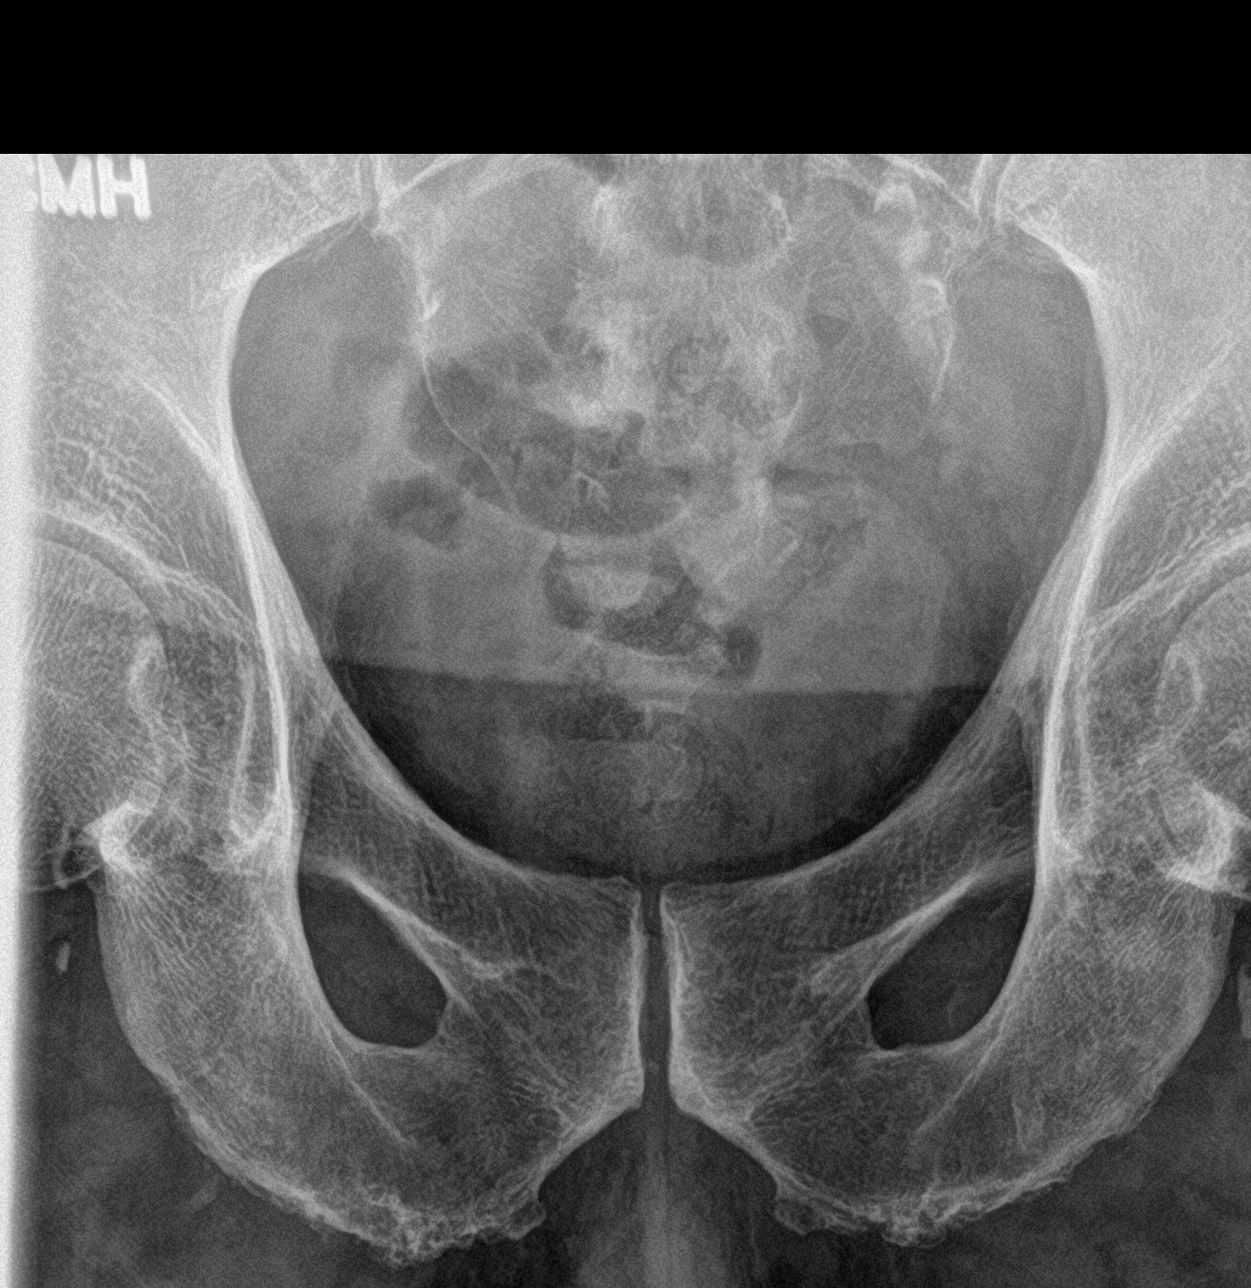
[im 3/3]
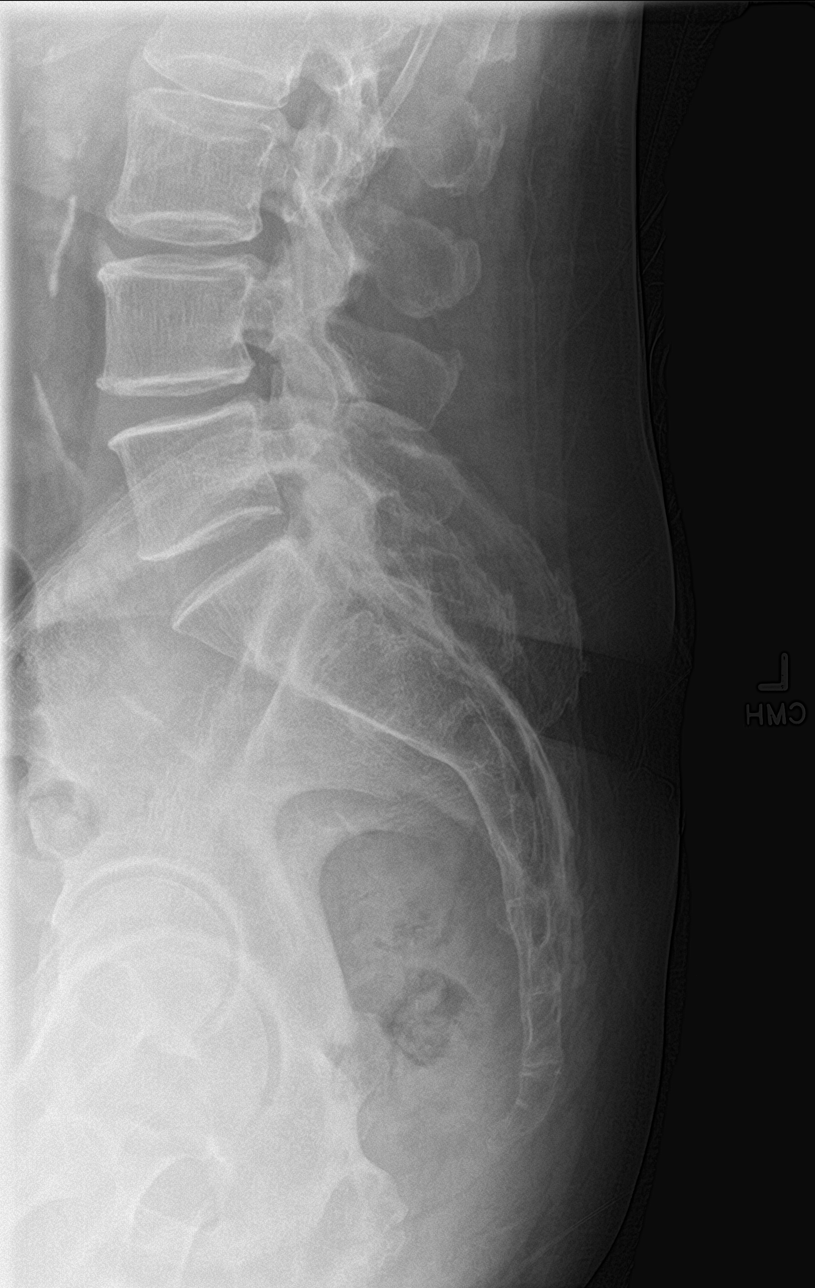

[3 of 3 positions shown; findings below may reference images not displayed]

FINDINGS: Frontal, angled frontal, and lateral views were obtained. No
fracture or diastasis. Joint spaces appear normal. No erosive
change. Probable bone island seen in the superomedial left iliac
bone measuring 1.6 x 1.3 cm.
IMPRESSION: No fracture or diastasis.  No appreciable arthropathy.

## 2022-04-29 IMAGING — CR DG LUMBAR SPINE 2-3V
1 series · 3 of 3 positions shown · non-contrast
Comparison: None.

CLINICAL DATA: Pain following fall

EXAM:
LUMBAR SPINE - 2-3 VIEW

[Series 1: dg lumbar spine 2-3 views · 0.14mm/px · 3 of 3 slices shown]
[im 1/3]
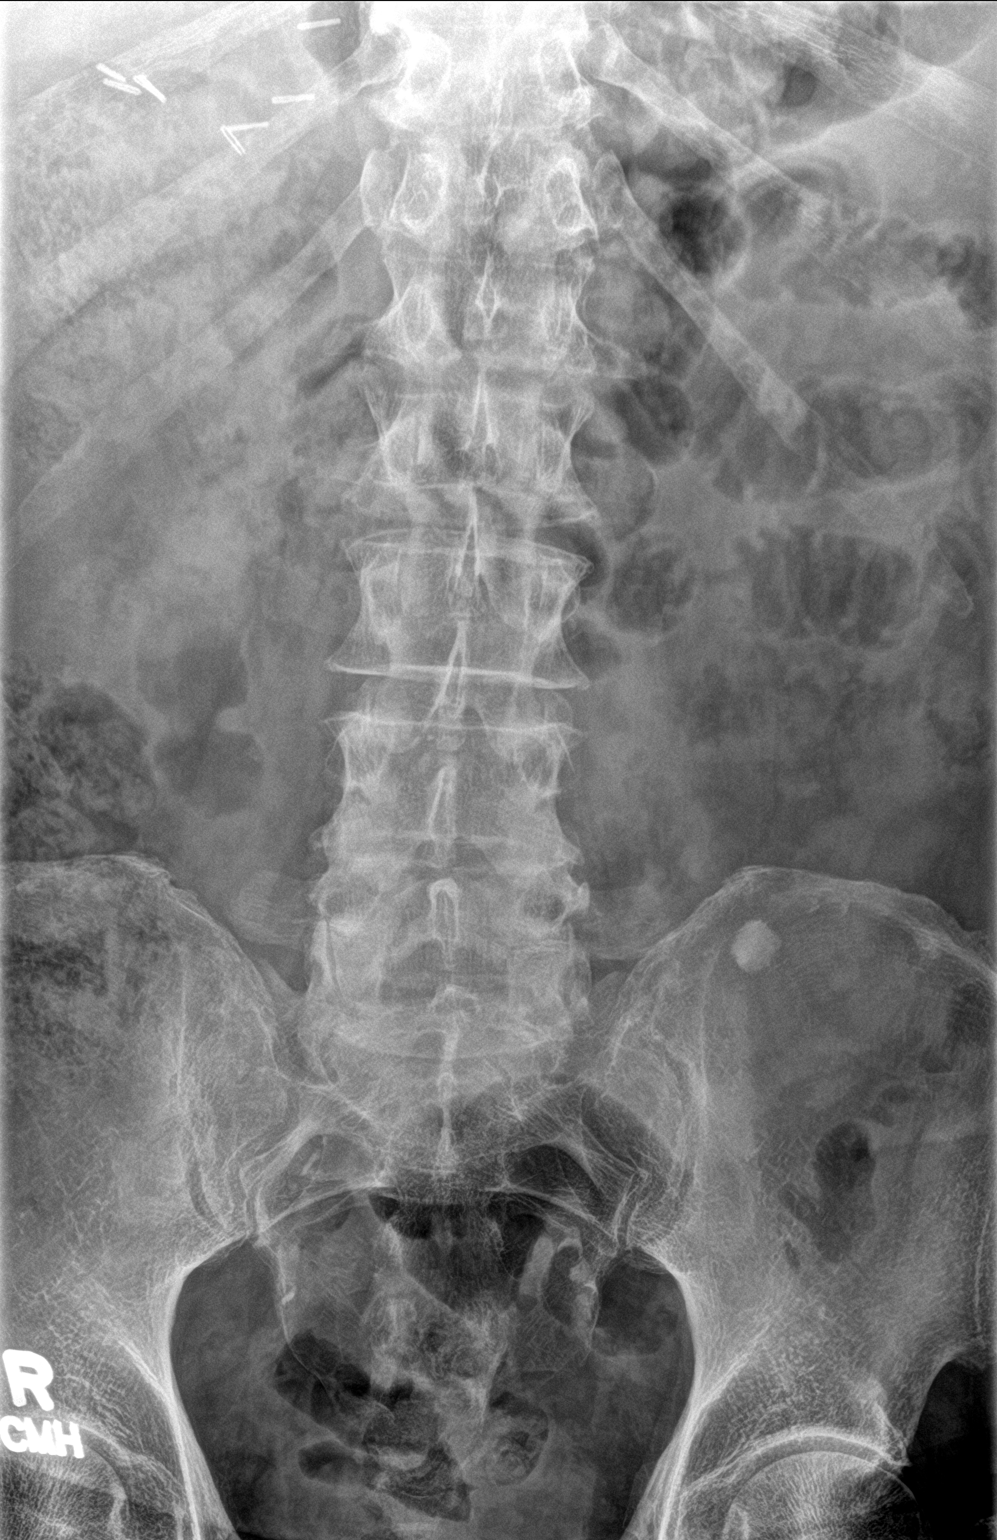
[im 2/3]
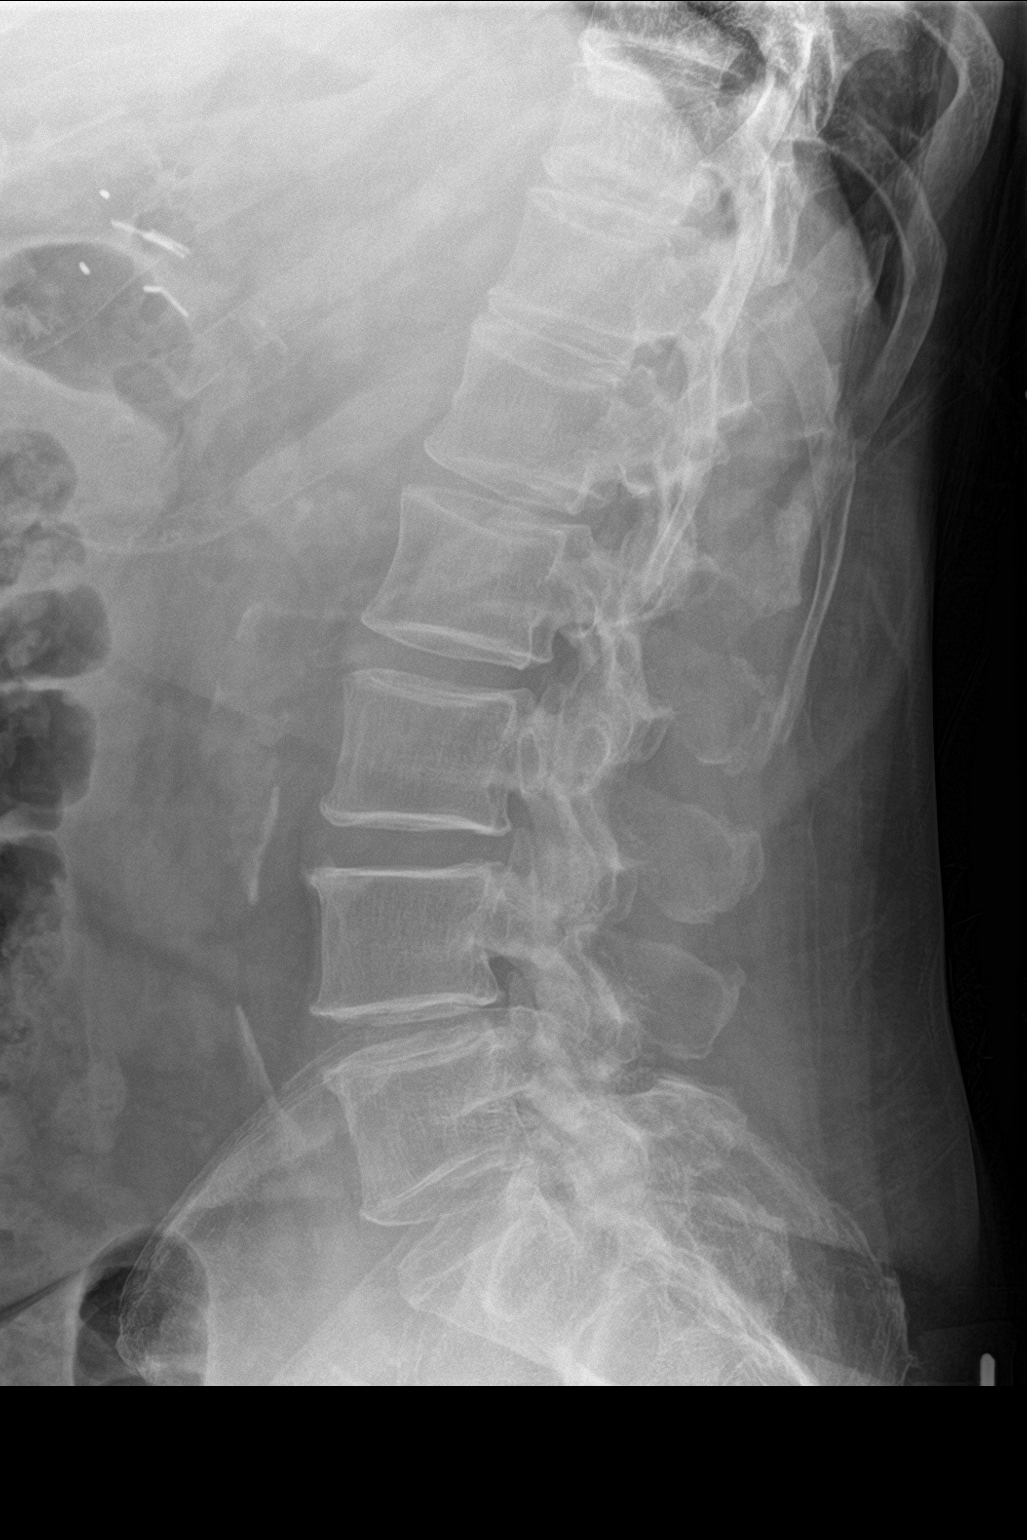
[im 3/3]
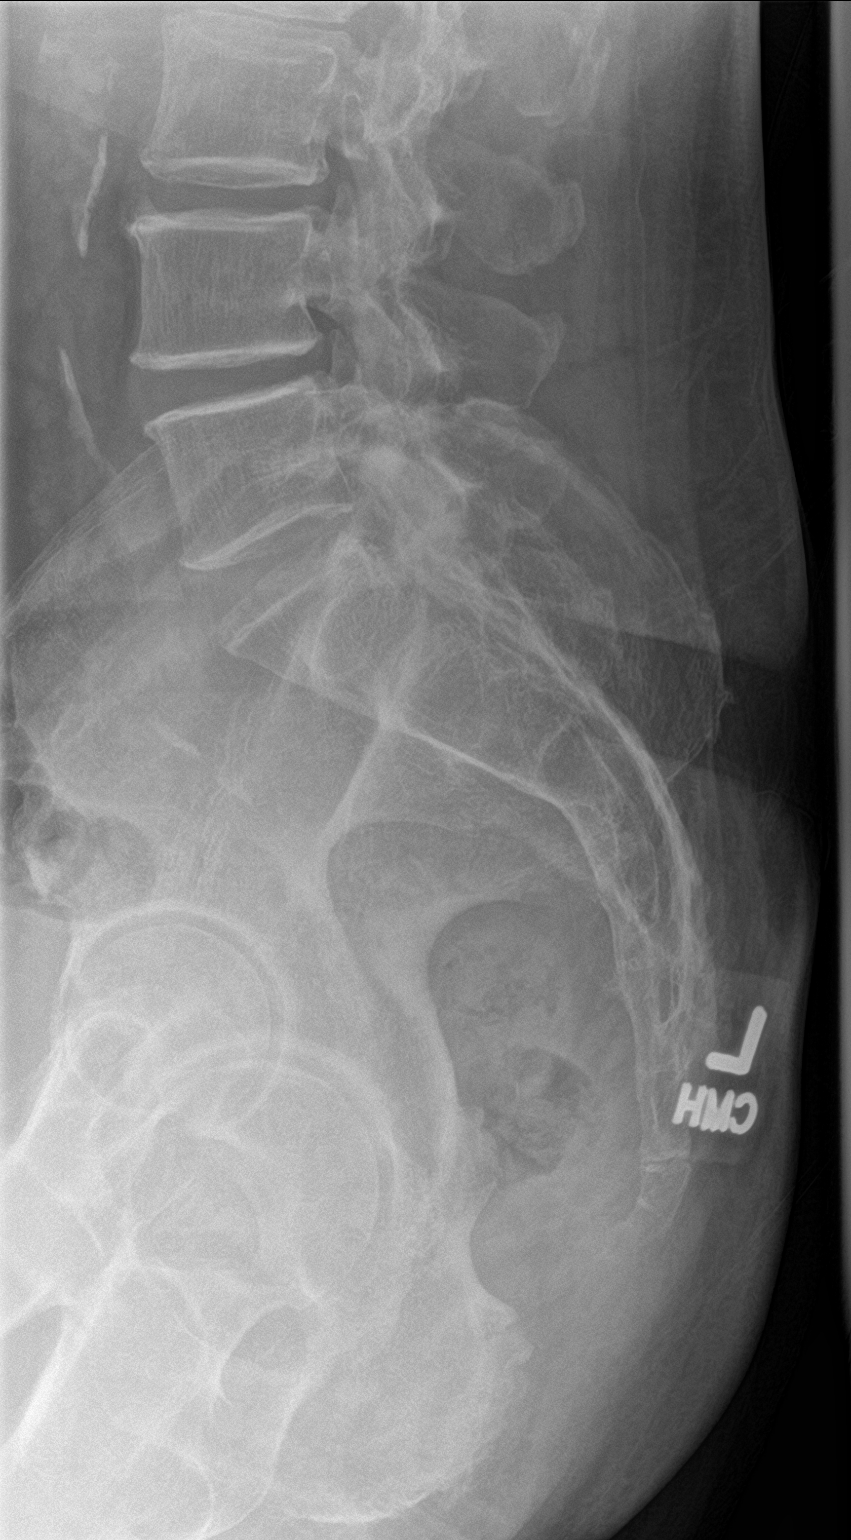

[3 of 3 positions shown; findings below may reference images not displayed]

FINDINGS: Frontal, lateral, and spot lumbosacral lateral images were obtained.
There are 5 non-rib-bearing lumbar type vertebral bodies. There is
no fracture or spondylolisthesis. There is no appreciable disc space
narrowing. There is facet osteoarthritic change at L5-S1
bilaterally. There is an apparent bone island in the superomedial
left iliac bone. There is aortic atherosclerosis.
IMPRESSION: Facet osteoarthritic change at L5-S1. No appreciable disc space
narrowing. No fracture or spondylolisthesis.

Aortic Atherosclerosis ([id number]).
